# Patient Record
Sex: Female | Born: 1975 | Race: Black or African American | Hispanic: No | Marital: Single | Smoking: Never smoker
Health system: Southern US, Community
[De-identification: ages and names within clinical notes are randomized; demographics above are authoritative.]

## PROBLEM LIST (undated history)

## (undated) ENCOUNTER — Ambulatory Visit (HOSPITAL_COMMUNITY): Admission: EM | Payer: Self-pay

## (undated) DIAGNOSIS — B029 Zoster without complications: Secondary | ICD-10-CM

## (undated) HISTORY — PX: NO PAST SURGERIES: SHX2092

---

## 2001-03-16 ENCOUNTER — Other Ambulatory Visit: Admission: RE | Admit: 2001-03-16 | Discharge: 2001-03-16 | Payer: Self-pay | Admitting: *Deleted

## 2002-06-04 ENCOUNTER — Other Ambulatory Visit: Admission: RE | Admit: 2002-06-04 | Discharge: 2002-06-04 | Payer: Self-pay | Admitting: *Deleted

## 2003-01-03 ENCOUNTER — Other Ambulatory Visit: Admission: RE | Admit: 2003-01-03 | Discharge: 2003-01-03 | Payer: Self-pay | Admitting: *Deleted

## 2003-07-14 ENCOUNTER — Encounter: Payer: Self-pay | Admitting: *Deleted

## 2003-07-14 ENCOUNTER — Inpatient Hospital Stay (HOSPITAL_COMMUNITY): Admission: AD | Admit: 2003-07-14 | Discharge: 2003-07-14 | Payer: Self-pay | Admitting: *Deleted

## 2003-07-15 ENCOUNTER — Encounter (INDEPENDENT_AMBULATORY_CARE_PROVIDER_SITE_OTHER): Payer: Self-pay | Admitting: *Deleted

## 2003-07-15 ENCOUNTER — Inpatient Hospital Stay (HOSPITAL_COMMUNITY): Admission: AD | Admit: 2003-07-15 | Discharge: 2003-07-16 | Payer: Self-pay | Admitting: Obstetrics and Gynecology

## 2003-08-18 ENCOUNTER — Other Ambulatory Visit: Admission: RE | Admit: 2003-08-18 | Discharge: 2003-08-18 | Payer: Self-pay | Admitting: *Deleted

## 2004-09-24 ENCOUNTER — Other Ambulatory Visit: Admission: RE | Admit: 2004-09-24 | Discharge: 2004-09-24 | Payer: Self-pay | Admitting: Obstetrics and Gynecology

## 2005-06-11 ENCOUNTER — Emergency Department (HOSPITAL_COMMUNITY): Admission: EM | Admit: 2005-06-11 | Discharge: 2005-06-11 | Payer: Self-pay | Admitting: Emergency Medicine

## 2005-08-19 ENCOUNTER — Ambulatory Visit (HOSPITAL_COMMUNITY): Admission: RE | Admit: 2005-08-19 | Discharge: 2005-08-19 | Payer: Self-pay | Admitting: Obstetrics and Gynecology

## 2005-09-24 ENCOUNTER — Other Ambulatory Visit: Admission: RE | Admit: 2005-09-24 | Discharge: 2005-09-24 | Payer: Self-pay | Admitting: Obstetrics and Gynecology

## 2005-11-18 ENCOUNTER — Ambulatory Visit: Payer: Self-pay | Admitting: Neonatology

## 2005-11-18 ENCOUNTER — Inpatient Hospital Stay (HOSPITAL_COMMUNITY): Admission: AD | Admit: 2005-11-18 | Discharge: 2005-11-20 | Payer: Self-pay | Admitting: Obstetrics and Gynecology

## 2005-11-18 ENCOUNTER — Encounter (INDEPENDENT_AMBULATORY_CARE_PROVIDER_SITE_OTHER): Payer: Self-pay | Admitting: Specialist

## 2007-07-09 ENCOUNTER — Emergency Department (HOSPITAL_COMMUNITY): Admission: EM | Admit: 2007-07-09 | Discharge: 2007-07-09 | Payer: Self-pay | Admitting: Emergency Medicine
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
## 2011-05-17 NOTE — Discharge Summary (Signed)
Erica Fields, Erica Fields                         ACCOUNT NO.:  [id number]   MEDICAL RECORD NO.:  [id number]                   PATIENT TYPE:  INP   LOCATION:  9302                                 FACILITY:  [facility]   PHYSICIAN:  Dineen Kid. Rana Snare, M.D.                 DATE OF BIRTH:  [birth year]   DATE OF ADMISSION:  07/15/2003  DATE OF DISCHARGE:  07/16/2003                                 DISCHARGE SUMMARY   ADMISSION DIAGNOSES:  1. Intrauterine pregnancy at 37-5/7 weeks estimated gestational age.  2. Intrauterine fetal demise.   DISCHARGE DIAGNOSES:  1. Status post spontaneous vaginal delivery.  2. Nonviable female infant.   PROCEDURE:  Spontaneous vaginal delivery.   REASON FOR ADMISSION:  Please see dictated H&P.   HOSPITAL COURSE:  The patient is a 35 year old gravida 2, para 1 that  presented to the office for a routine OB visit.  In the office, fetal heart  tones were not audible and ultrasound was performed revealing a nonviable  fetus.  Her pregnancy had been uncomplicated.  The patient denied any recent  changes in activity other than not feeling the fetus move yesterday.  The  patient was then sent to the hospital on the evening prior to admission for  confirmation of office findings which revealed ultrasound finding consistent  with intrauterine fetal demise.  The patient was then scheduled for an  induction of labor.  Cervix was 3-4 cm dilated, 90% effaced, vertex at a  minus 2 station.  Artificial rupture of membranes was performed revealing  blood-tinged fluid.  Pitocin was started to initiate labor.  Epidural was  placed for the patient's comfort.  The patient did have a spontaneous  vaginal delivery of a nonviable infant weighing 8 pounds 8 ounces.  No  obvious fetal malformation was noted.  The patient did sustain a first-  degree right vaginal laceration which was repaired with 2-0 chromic.  Mother  did tolerate the procedure well.  Estimated blood loss was less than 400  mL.  On postpartum day #1, CBC, STS, ANA, TFTs, and hemoglobin A1c were drawn.  Temp max during the night was 101.4.  The patient was afebrile the following  morning.  Fundus was firm, uterus even, nontender.  CBC revealed a  hemoglobin of 10.4, platelet count of 203,000, WBC count of 21.6.  Hemoglobin A1c was 5, TSH was normal.  STS was negative.  Other labs were  pending at the patient's discharge.  Consent was signed for fetal autopsy.  Funeral arrangements were made and the patient was discharged home.   CONDITION ON DISCHARGE:  Stable.   DIET:  Regular as tolerated.   ACTIVITY:  No restrictions other than no vaginal entry, douches, tampons, or  intercourse.   FOLLOW UP:  The patient is to follow up in the office in four weeks.  She is  to call for a temperature greater than  100 degrees, excessive heavy vaginal  bleeding.    DISCHARGE MEDICATIONS:  1. Keflex 500 mg one p.o. t.i.d. x1 week.  2. Xanax 0.25 mg one p.o. t.i.d. p.r.n.  3. Ambien 10 mg one p.o. at h.s. p.r.n. insomnia.     Julio Sicks, N.P.                        Dineen Kid Rana Snare, M.D.    CC/MEDQ  D:  08/05/2003  T:  08/05/2003  Job:  [id number]

## 2012-12-30 NOTE — L&D Delivery Note (Signed)
Delivery Note At 7:44 PM a viable female was delivered via  OA  APGAR: pending weight 3 lb 9.5 oz (1630 g).   Placenta status: consistent with placental abruption .  Cord:  with the following complications: loose nuchal cord x 1  Cord pH: not done  Anesthesia: none  Episiotomy:none Lacerations: none Suture Repair: not applicable Est. Blood Loss (mL): 300  Mom to postpartum.  Baby to NICU.  Cheryln Balcom L 07/03/2013, 7:58 PM

## 2013-03-02 LAB — OB RESULTS CONSOLE GC/CHLAMYDIA: Chlamydia: NEGATIVE

## 2013-03-05 ENCOUNTER — Encounter (HOSPITAL_COMMUNITY): Payer: Self-pay | Admitting: Emergency Medicine

## 2013-03-05 ENCOUNTER — Emergency Department (HOSPITAL_COMMUNITY)
Admission: EM | Admit: 2013-03-05 | Discharge: 2013-03-05 | Disposition: A | Discharge status: Home / Self Care | Payer: BC Managed Care – PPO | Attending: Emergency Medicine | Admitting: Emergency Medicine

## 2013-03-05 DIAGNOSIS — O034 Incomplete spontaneous abortion without complication: Secondary | ICD-10-CM | POA: Insufficient documentation

## 2013-03-05 DIAGNOSIS — Z8742 Personal history of other diseases of the female genital tract: Secondary | ICD-10-CM | POA: Insufficient documentation

## 2013-03-05 DIAGNOSIS — O4692 Antepartum hemorrhage, unspecified, second trimester: Secondary | ICD-10-CM

## 2013-03-05 DIAGNOSIS — O09219 Supervision of pregnancy with history of pre-term labor, unspecified trimester: Secondary | ICD-10-CM | POA: Insufficient documentation

## 2013-03-05 DIAGNOSIS — O209 Hemorrhage in early pregnancy, unspecified: Secondary | ICD-10-CM | POA: Insufficient documentation

## 2013-03-05 NOTE — ED Notes (Signed)
Pt states that she is [redacted] weeks pregnant and she was having intercourse and she started bleeding,  She denies cramping but states there was a little pain during intercourse.

## 2013-03-05 NOTE — ED Notes (Signed)
Pt states she is [redacted] weeks pregnant and started having vaginal bleeding after having intercoarse tonight

## 2013-03-05 NOTE — ED Provider Notes (Signed)
History     CSN: [id number]  Arrival date & time 03/05/13  0254   First MD Initiated Contact with Patient 03/05/13 [phone]      Chief Complaint  Patient presents with  . Vaginal Bleeding    (Consider location/radiation/quality/duration/timing/severity/associated sxs/prior treatment) HPI Erica Fields is a 37 y.o. female presents with vaginal bleeding in 13 weeks pregnancy. She is a G4 P1 A2, history of prior miscarriages and prior premature birth. She does have followup at the [facility]. Patient decided to come here because of the weather and is closer to come here. She's having no pain, no contractions, denies any rush of fluid. Earlier this evening was engaged in sexual intercourse and then noticed vaginal bleeding. Vaginal bleeding has been constant, he has been using the same that since it started about an hour or 2 ago, no other alleviating or exacerbating factors no other associated symptoms. She denies any shortness of breath, chest pain, dizziness or lightheadedness the   History reviewed. No pertinent past medical history.  History reviewed. No pertinent past surgical history.  Family History  Problem Relation Age of Onset  . Hypertension Other   . Diabetes Other   . CAD Other     History  Substance Use Topics  . Smoking status: Never Smoker   . Smokeless tobacco: Not on file  . Alcohol Use: No    OB History   Grav Para Term Preterm Abortions TAB SAB Ect Mult Living   4         1      Review of Systems At least 10pt or greater review of systems completed and are negative except where specified in the HPI.  Allergies  Review of patient's allergies indicates no known allergies.  Home Medications  No current outpatient prescriptions on file.  BP 114/60  Pulse 78  Temp(Src) 98 F (36.7 C) (Oral)  Resp 18  Wt 209 lb 8 oz (95.029 kg)  SpO2 100%  LMP 12/09/2012  Physical Exam  Nursing notes reviewed.  Electronic medical record reviewed. VITAL  SIGNS:   Filed Vitals:   03/05/13 0300 03/05/13 0444  BP: 114/60   Pulse: 78   Temp: 98 F (36.7 C)   TempSrc: Oral   Resp: 18   Weight: 209 lb 8 oz (95.029 kg)   SpO2: 100% 100%   CONSTITUTIONAL: Awake, oriented, appears non-toxic HENT: Atraumatic, normocephalic, oral mucosa pink and moist, airway patent. Nares patent without drainage. External ears normal. EYES: Conjunctiva clear, EOMI, PERRLA NECK: Trachea midline, non-tender, supple CARDIOVASCULAR: Normal heart rate, Normal rhythm, No murmurs, rubs, gallops PULMONARY/CHEST: Clear to auscultation, no rhonchi, wheezes, or rales. Symmetrical breath sounds. Non-tender. ABDOMINAL: Non-distended, gravid, soft, non-tender - no rebound or guarding.  BS normal. NEUROLOGIC: Non-focal, moving all four extremities, no gross sensory or motor deficits. EXTREMITIES: No clubbing, cyanosis, or edema SKIN: Warm, Dry, No erythema, No rash PELVIC EXAM: normal external genitalia - some blood noticed at the vulva, vagina has some pooling dark blood in the vault, cervix appears to be opening less than 1 cm this time, but there is a steady stream of dark red fluid and occasional clots coming from it, uterus and adnexa. ED Course  [place] bedside Performed by: Jones Skene Authorized by: Jones Skene Consent: Verbal consent obtained. Consent given by: patient Local anesthesia used: no Patient sedated: no Comments: Transabdominal bedside ultrasound shows a single intrauterine fetus,  Heart rate 140-150, moving   (including critical care time)  Labs Reviewed  WET PREP, GENITAL   No results found.   1. Inevitable abortion   2. Vaginal bleeding in pregnancy, second trimester       MDM  Erica Fields is a 37 y.o. female presenting with vaginal bleeding and a second trimester, patient's os is open on physical exam, there continues to be clots and what appeared to be small pieces of tissue coming through. At this point, the fetus is still  alive, with a healthy heart rate in the 140s to 150s. I discussed this at length with the mother at bedside and the father, this is likely in an inevitable abortion, she will need followup with OB/GYN for repeat pelvic exam, and repeat ultrasound if she does not pass the products of conception.  Patient understands and accepts the medical plan, she knows she needs to followup with OB/GYN, she is to return immediately for any worsening symptoms including syncope, shortness of breath, chest pain, or any other concerning symptoms.        Jones Skene, MD 03/09/13 1349

## 2013-03-09 LAB — OB RESULTS CONSOLE ANTIBODY SCREEN: Antibody Screen: NEGATIVE

## 2013-03-09 LAB — OB RESULTS CONSOLE HIV ANTIBODY (ROUTINE TESTING): HIV: NONREACTIVE

## 2013-03-09 LAB — OB RESULTS CONSOLE ABO/RH: RH Type: POSITIVE

## 2013-03-09 LAB — OB RESULTS CONSOLE RPR: RPR: NONREACTIVE

## 2013-03-09 LAB — OB RESULTS CONSOLE HEPATITIS B SURFACE ANTIGEN: Hepatitis B Surface Ag: NEGATIVE

## 2013-06-04 ENCOUNTER — Inpatient Hospital Stay (HOSPITAL_COMMUNITY)
Admission: AD | Admit: 2013-06-04 | Discharge: 2013-07-06 | DRG: 372 | Disposition: A | Discharge status: Home / Self Care | Payer: BC Managed Care – PPO | Source: Ambulatory Visit | Attending: Obstetrics and Gynecology | Admitting: Obstetrics and Gynecology

## 2013-06-04 ENCOUNTER — Encounter (HOSPITAL_COMMUNITY): Payer: Self-pay | Admitting: *Deleted

## 2013-06-04 DIAGNOSIS — Z3689 Encounter for other specified antenatal screening: Secondary | ICD-10-CM

## 2013-06-04 DIAGNOSIS — O4290 Premature rupture of membranes, unspecified as to length of time between rupture and onset of labor, unspecified weeks of gestation: Secondary | ICD-10-CM | POA: Diagnosis present

## 2013-06-04 DIAGNOSIS — O4590 Premature separation of placenta, unspecified, unspecified trimester: Secondary | ICD-10-CM | POA: Diagnosis not present

## 2013-06-04 DIAGNOSIS — O09529 Supervision of elderly multigravida, unspecified trimester: Secondary | ICD-10-CM | POA: Diagnosis present

## 2013-06-04 DIAGNOSIS — O321XX0 Maternal care for breech presentation, not applicable or unspecified: Secondary | ICD-10-CM | POA: Diagnosis present

## 2013-06-04 DIAGNOSIS — O3430 Maternal care for cervical incompetence, unspecified trimester: Principal | ICD-10-CM | POA: Diagnosis present

## 2013-06-04 DIAGNOSIS — O26879 Cervical shortening, unspecified trimester: Secondary | ICD-10-CM | POA: Diagnosis present

## 2013-06-04 LAB — CBC
HCT: 29.1 % — ABNORMAL LOW (ref 36.0–46.0)
MCV: 76.8 fL — ABNORMAL LOW (ref 78.0–100.0)
Platelets: 214 10*3/uL (ref 150–400)
RBC: 3.79 MIL/uL — ABNORMAL LOW (ref 3.87–5.11)
WBC: 7.8 10*3/uL (ref 4.0–10.5)

## 2013-06-04 MED ORDER — CALCIUM CARBONATE ANTACID 500 MG PO CHEW: 2.0000 | CHEWABLE_TABLET | ORAL | Status: DC | PRN

## 2013-06-04 MED ORDER — DOCUSATE SODIUM 100 MG PO CAPS
100.0000 mg | ORAL_CAPSULE | Freq: Every day | ORAL | Status: DC
  Administered 2013-06-05 – 2013-06-06 (×2): 100 mg via ORAL
  Filled 2013-06-04 (×2): qty 1

## 2013-06-04 MED ORDER — ZOLPIDEM TARTRATE 5 MG PO TABS: 5.0000 mg | ORAL_TABLET | Freq: Every evening | ORAL | Status: DC | PRN

## 2013-06-04 MED ORDER — BETAMETHASONE SOD PHOS & ACET 6 (3-3) MG/ML IJ SUSP
12.0000 mg | INTRAMUSCULAR | Status: AC
  Administered 2013-06-04 – 2013-06-05 (×2): 12 mg via INTRAMUSCULAR
  Filled 2013-06-04 (×2): qty 2

## 2013-06-04 MED ORDER — HYDROXYPROGESTERONE CAPROATE 250 MG/ML IM OIL: 250.0000 mg | TOPICAL_OIL | INTRAMUSCULAR | Status: DC

## 2013-06-04 MED ORDER — PRENATAL MULTIVITAMIN CH
1.0000 | ORAL_TABLET | Freq: Every day | ORAL | Status: DC
  Administered 2013-06-06: 1 via ORAL
  Filled 2013-06-04 (×2): qty 1

## 2013-06-04 MED ORDER — ACETAMINOPHEN 325 MG PO TABS: 650.0000 mg | ORAL_TABLET | ORAL | Status: DC | PRN

## 2013-06-04 NOTE — Progress Notes (Signed)
Pt GBS completed 06/04/13 1410

## 2013-06-04 NOTE — H&P (Signed)
37 yo [id number] @ 26+2 wks presents for admission because of ACE/ACD.  Pt had [place] in office today and incidentally found to have cvx length of 1.1cm (previously 3.3 at 18 wk [place]).  Denies ctx, vb, and lof.  + FM  PObHx:  G1: EAB, G2: 38wks IUFD/SVD, G3: 22wks SVD - female infant living, G4: 6 wks SAB, G5: current PMHx: abnormal pap - cryo '99 PSHx:  Neg All:  None Meds:  P4 weekly inj, PNV SHx:  Negative tobacco  Af, VSS + FHT  Toco quiet Gen - NAD Abd - gravid, NT Ext - NT, no edema PV - 2cm per MD exam in office  [place]:  cvx 1.1 cm w/ funnel  A/P:  ACE/ACD at 26 wks with poor pregnancy history Admit Bedrest BMZ (will do gtt first) Continue progesterone inj Re-evaluate cervix after steroids Tocolysis if evidence of PTL

## 2013-06-05 ENCOUNTER — Encounter (HOSPITAL_COMMUNITY): Payer: Self-pay | Admitting: *Deleted

## 2013-06-05 NOTE — Progress Notes (Signed)
Pt denies ctx, vb and lof.  + FM AF, VSS + FHT Toco quiet  Gen - NAD Abd - gravid, NT Ext - NT PV - deferred  A/P:  Continue hospital bedrest & BMZ & P4 Re assess cervix Monday and consider home bedrest if stable

## 2013-06-06 ENCOUNTER — Inpatient Hospital Stay (HOSPITAL_COMMUNITY): Payer: BC Managed Care – PPO

## 2013-06-06 MED ORDER — DEXTROSE 5 % IV SOLN
500.0000 mg | INTRAVENOUS | Status: DC
  Administered 2013-06-06: 500 mg via INTRAVENOUS
  Filled 2013-06-06 (×2): qty 500

## 2013-06-06 MED ORDER — SODIUM CHLORIDE 0.9 % IV SOLN
1.0000 g | INTRAVENOUS | Status: DC
  Filled 2013-06-06 (×4): qty 1000

## 2013-06-06 MED ORDER — SODIUM CHLORIDE 0.9 % IV SOLN
1.0000 g | INTRAVENOUS | Status: DC
  Administered 2013-06-06 – 2013-06-07 (×3): 1 g via INTRAVENOUS
  Filled 2013-06-06 (×6): qty 1000

## 2013-06-06 MED ORDER — AZITHROMYCIN 500 MG PO TABS: 500.0000 mg | ORAL_TABLET | Freq: Every day | ORAL | Status: DC

## 2013-06-06 MED ORDER — AMOXICILLIN 500 MG PO CAPS: 500.0000 mg | ORAL_CAPSULE | Freq: Two times a day (BID) | ORAL | Status: DC

## 2013-06-06 MED ORDER — AMPICILLIN SODIUM 2 G IJ SOLR
2.0000 g | Freq: Once | INTRAMUSCULAR | Status: AC
  Administered 2013-06-06: 2 g via INTRAVENOUS
  Filled 2013-06-06: qty 2000

## 2013-06-06 NOTE — Progress Notes (Signed)
Pt denies ctx, vb and lof.  + FM  AF, VSS + FHT Toco quiet gen - NAD Abd - gravid, NT Cvx - deferred  A/P:  ACE/ACD S/p BMZ Recheck cervical length today Continue bedrest, progesterone

## 2013-06-06 NOTE — Progress Notes (Signed)
Ultrasound called with results of cervical length - no measurable cervix with bulging membranes.  Breech presentation.  Pt denies change in sx.  Denies ctx, vb and lof.  + FM  Plan for prolonged hospital bedrest S/p bmz Continue progesterone Start latency abx now that membranes exposed

## 2013-06-07 LAB — CULTURE, BETA STREP (GROUP B ONLY)

## 2013-06-07 MED ORDER — CALCIUM CARBONATE ANTACID 500 MG PO CHEW: 2.0000 | CHEWABLE_TABLET | ORAL | Status: DC | PRN

## 2013-06-07 MED ORDER — CITRIC ACID-SODIUM CITRATE 334-500 MG/5ML PO SOLN: 30.0000 mL | ORAL | Status: DC | PRN

## 2013-06-07 MED ORDER — DEXTROSE 5 % IV SOLN
500.0000 mg | INTRAVENOUS | Status: AC
  Administered 2013-06-07: 500 mg via INTRAVENOUS
  Filled 2013-06-07: qty 500

## 2013-06-07 MED ORDER — SODIUM CHLORIDE 0.9 % IV SOLN
2.0000 g | Freq: Once | INTRAVENOUS | Status: DC
  Filled 2013-06-07: qty 2000

## 2013-06-07 MED ORDER — AMOXICILLIN 500 MG PO CAPS
500.0000 mg | ORAL_CAPSULE | Freq: Three times a day (TID) | ORAL | Status: AC
  Administered 2013-06-08 – 2013-06-13 (×15): 500 mg via ORAL
  Filled 2013-06-07 (×15): qty 1

## 2013-06-07 MED ORDER — ACETAMINOPHEN 325 MG PO TABS: 650.0000 mg | ORAL_TABLET | ORAL | Status: DC | PRN

## 2013-06-07 MED ORDER — LIDOCAINE HCL (PF) 1 % IJ SOLN: 30.0000 mL | INTRAMUSCULAR | Status: DC | PRN

## 2013-06-07 MED ORDER — OXYTOCIN BOLUS FROM INFUSION: 500.0000 mL | INTRAVENOUS | Status: DC

## 2013-06-07 MED ORDER — HYDROXYPROGESTERONE CAPROATE 250 MG/ML IM OIL
250.0000 mg | TOPICAL_OIL | INTRAMUSCULAR | Status: DC
  Administered 2013-06-11 – 2013-07-02 (×4): 250 mg via INTRAMUSCULAR
  Filled 2013-06-07 (×3): qty 1

## 2013-06-07 MED ORDER — PRENATAL MULTIVITAMIN CH
1.0000 | ORAL_TABLET | Freq: Every day | ORAL | Status: DC
  Administered 2013-06-07 – 2013-07-03 (×27): 1 via ORAL
  Filled 2013-06-07 (×28): qty 1

## 2013-06-07 MED ORDER — ZOLPIDEM TARTRATE 5 MG PO TABS: 5.0000 mg | ORAL_TABLET | Freq: Every evening | ORAL | Status: DC | PRN

## 2013-06-07 MED ORDER — ONDANSETRON HCL 4 MG/2ML IJ SOLN: 4.0000 mg | Freq: Four times a day (QID) | INTRAMUSCULAR | Status: DC | PRN

## 2013-06-07 MED ORDER — OXYCODONE-ACETAMINOPHEN 5-325 MG PO TABS: 1.0000 | ORAL_TABLET | ORAL | Status: DC | PRN

## 2013-06-07 MED ORDER — LACTATED RINGERS IV SOLN: 500.0000 mL | INTRAVENOUS | Status: DC | PRN

## 2013-06-07 MED ORDER — SODIUM CHLORIDE 0.9 % IV SOLN
2.0000 g | Freq: Four times a day (QID) | INTRAVENOUS | Status: AC
  Administered 2013-06-07 – 2013-06-08 (×6): 2 g via INTRAVENOUS
  Filled 2013-06-07 (×6): qty 2000

## 2013-06-07 MED ORDER — DOCUSATE SODIUM 100 MG PO CAPS
100.0000 mg | ORAL_CAPSULE | Freq: Every day | ORAL | Status: DC
  Administered 2013-06-07 – 2013-07-02 (×26): 100 mg via ORAL
  Filled 2013-06-07 (×26): qty 1

## 2013-06-07 MED ORDER — OXYTOCIN 40 UNITS IN LACTATED RINGERS INFUSION - SIMPLE MED: 62.5000 mL/h | INTRAVENOUS | Status: DC

## 2013-06-07 MED ORDER — AZITHROMYCIN 500 MG PO TABS
500.0000 mg | ORAL_TABLET | Freq: Every day | ORAL | Status: AC
  Administered 2013-06-08 – 2013-06-12 (×5): 500 mg via ORAL
  Filled 2013-06-07 (×6): qty 1

## 2013-06-07 MED ORDER — LACTATED RINGERS IV SOLN: INTRAVENOUS | Status: DC

## 2013-06-07 MED ORDER — IBUPROFEN 600 MG PO TABS: 600.0000 mg | ORAL_TABLET | Freq: Four times a day (QID) | ORAL | Status: DC | PRN

## 2013-06-07 NOTE — Progress Notes (Signed)
[redacted]w[redacted]d   S//  Resting comft, no c/o  O//BP 103/56  Pulse 79  Temp(Src) 99 F (37.2 C) (Oral)  Resp 18  Ht 5\' 7"  (1.702 m)  Wt 219 lb (99.338 kg)  BMI 34.29 kg/m2  LMP 12/09/2012  FHR stable, rare ctx   A+P// [redacted]w[redacted]d, Breech, PTL/short cx, cont BR + ABX

## 2013-06-07 NOTE — Progress Notes (Addendum)
Pt. Reported to me that she thinks she lost her mucous plug, but not leaking of fluid or vaginal bleeding at this time. She is also not contracting. Will continue to monitor.

## 2013-06-08 MED ORDER — POLYETHYLENE GLYCOL 3350 17 G PO PACK
17.0000 g | PACK | Freq: Every day | ORAL | Status: DC
  Administered 2013-06-08 – 2013-07-02 (×23): 17 g via ORAL
  Filled 2013-06-08 (×27): qty 1

## 2013-06-08 MED ORDER — SODIUM CHLORIDE 0.9 % IJ SOLN
3.0000 mL | Freq: Two times a day (BID) | INTRAMUSCULAR | Status: DC
  Administered 2013-06-09 (×2): 3 mL via INTRAVENOUS

## 2013-06-08 NOTE — Progress Notes (Signed)
No current c/o.  No change in discharge.  No VB, LOF, CTX.  +FM.    VSS. AF Toco quiet FHT Cat I Gen: A&O x 3 Abd: soft, gravid, NT Ext: no c/c/e  36yo [id number] at [redacted]w[redacted]d with ACE/ACD -D3 latency abx for exposed membranes -BMZ mature  -Continue progesterone -Bedrest

## 2013-06-09 MED ORDER — BISACODYL 10 MG RE SUPP
10.0000 mg | Freq: Once | RECTAL | Status: AC
  Administered 2013-06-09: 10 mg via RECTAL
  Filled 2013-06-09: qty 1

## 2013-06-09 NOTE — Consult Note (Signed)
Asked by Dr. Vincente Poli to provide prenatal consultation for patient at risk for preterm delivery due to preterm labor/cervical incompetence.  Mother is 36y.o. G5 P1-1-2-1 who is now [redacted] weeks EGA, was admitted 6/6 for cervical shortening and has been treated with betamethasone, progesterone, and antibiotics (for bulging membranes).  Discussed usual expectations for preterm infant at 27+ weeks gestation, including possible needs for DR resuscitation, respiratory support, IV access, and blood products.  Also presented risks of death or serious morbidity, including developmental disability.  Projected possible length of stay in NICU until 36 - [redacted] wks EGA.  She had a previous 23-week preterm female Jeri Modena) who had a prolonged NICU course here.    Discussed advantages of feeding with mother's milk, and encouraged her to consider pumping postnatally.  Patient was attentive, had appropriate questions, and was appreciative of my input.  Thank you for the consultation.  Aiyanna Awtrey E. Barrie Dunker., MD  Face-to-face time 20 minutes, total time 25 minutes

## 2013-06-09 NOTE — Progress Notes (Signed)
S: Patient is resting. Reports good fetal movement. Had some tightening last night - none now.   O: Afebrile VSS General alert and oriented Lung CTAB Car RRR Abdomen is soft and non tender  IMPRESSION: IUP at 27 weeks Advanced dilation/ probable cervical incompetence Was Breech yesterday  PLAN: Completed steroid series Complete antibiotics for bulging membranes - would discontinue after that Magnesium for neuroprotection if delivery imminent Check fetal position if delivery imminent - discussed with patient C Section may be necessary Continue IM Progesterone Neonatology consult

## 2013-06-09 NOTE — Progress Notes (Signed)
NICU consult called with Dr. Mikle Bosworth

## 2013-06-10 NOTE — Progress Notes (Signed)
Pt denies ctx, vb and lof.  + FM - reports she thinks baby flipped to vtx  AF, VSS  + FHT Gen - NAD Abd - gravid, NT  A/P:  Continue hospital bedrest, weekly progesterone, latency abx Consider rpt ultrasound at 28wks

## 2013-06-11 NOTE — Progress Notes (Signed)
Antenatal Nutrition Assessment:  Currently  27 2/[redacted] weeks gestation, with thin cervix. Height  67" Weight 221 Lbs pre-pregnancy weight unknown, PNR not available  IBW 135 Lbs  Total weight gain unknown. Weight gain goals 11- 20 lbs. Pt reports good weight gain.  Estimated needs: 2000-2200 kcal/day, 76-86 g grams protein/day, 2.4 liters fluid/day  Antenatal Regular diet tolerated well, appetite good. Current diet prescription will provide for increased needs.  No abnormal nutrition related labs  Nutrition Dx: Increased nutrient needs r/t pregnancy and fetal growth requirements aeb [redacted] weeks gestation.  No educational needs assessed at this time.  Elisabeth Cara M.Odis Luster LDN Neonatal Nutrition Support Specialist Pager [phone]

## 2013-06-11 NOTE — Progress Notes (Signed)
Erica Fields was in good spirits and is coping well with her situation.  She has a son who is 38 years old who was born here at [facility], at her report, at 22.5 weeks.  She is grateful and hopeful; she has a strong faith and good family support that is helping her through this.  We will continue to follow up with her, but please also page as needs arise, [phone].  [address] [place] Pager, [phone] 12:05 PM   06/11/13 1200  Clinical Encounter Type  Visited With Patient  Visit Type Initial

## 2013-06-11 NOTE — Progress Notes (Signed)
27 2/7 wks No bleeding/leaking No hard UCs  VSS Afeb FHT + Ut soft/NT  A: ACD/ACE  P: Continue hospitalization, BR, IM progesterone, latency ATB      C/W patient possible C/S for delivery depending on presentation      Magnesium Sulfate for neuro prophylaxis if possible

## 2013-06-12 NOTE — Progress Notes (Signed)
27 3/7 wks No C/O Had 3 UCs last pm, OK today No leaking/bleeding  VSS Afeb Ut soft and NT  FHT + accels UCs irregular and mild  A: ACE/ACD with poor ob history at 27 3/7 weeks P: Continue present care and reevaluate at 28 weeks      C/W patient plan

## 2013-06-12 NOTE — Progress Notes (Signed)
Pt moved to room 151 (for a better view) via w/c

## 2013-06-13 MED ORDER — BISACODYL 10 MG RE SUPP
10.0000 mg | Freq: Once | RECTAL | Status: AC
  Administered 2013-06-13: 10 mg via RECTAL
  Filled 2013-06-13: qty 1

## 2013-06-13 NOTE — Progress Notes (Signed)
27 4/7 weeks No leaking/bleeding Constipated  VSS Afeb Ut soft/NT FHT +accels UCs irreg / mild  A: ACE/ACD-no measurable cervix on U/S  P: Continue hospital BR

## 2013-06-14 ENCOUNTER — Inpatient Hospital Stay (HOSPITAL_COMMUNITY): Payer: BC Managed Care – PPO

## 2013-06-14 NOTE — Progress Notes (Signed)
Pt up to the bathroom for a.m. care

## 2013-06-14 NOTE — Progress Notes (Signed)
Patient ID: Erica Fields, female   DOB: Dec 30, 1976, 37 y.o.   MRN: [id number] No ctx's good fetal movement Af vss Gravid uterus nontender IUP at 27.5 with preterm cervical changes Continue rest

## 2013-06-14 NOTE — Progress Notes (Signed)
FHR strip non-reactive, mild contractions noted on monitor that pt denies feeling.  Orders received for BPP

## 2013-06-15 ENCOUNTER — Inpatient Hospital Stay (HOSPITAL_COMMUNITY): Payer: BC Managed Care – PPO

## 2013-06-15 ENCOUNTER — Ambulatory Visit (HOSPITAL_COMMUNITY): Payer: BC Managed Care – PPO

## 2013-06-15 NOTE — Consult Note (Signed)
MFM consult  37 yr old [id number] at [redacted]w[redacted]d with prior IUFD at 38 weeks, prior preterm delivery at 23-26 weeks, and now with preterm cervical dilation for fetal ultrasound and consult.  Patient was being followed with cervical length and is receiving 17OH progesterone given history. Cervical length had been normal. Presented to OB with vaginal bleeding and was found to be 2cm dilated. Reports no contractions. Has received betamethasone course.   Past OB hx:  Elective termination 38 week presented and found no fetal heart tones- SVD; autopsy normal; no clear etiology 3rd pregnancy: PPROM at patient reports [redacted]w[redacted]d- delivered female infant weighing over 1lb so likely more like 24-27 weeks; child is living 4th pregnancy; first trimester miscarriage  No other significant medical history  Ultrasound today shows: estimated fetal weight is in the 61st%. Anterior placenta without evidence of previa. Normal amniotic fluid volume. The limited anatomy survey is normal. The fetus is in cephalic presentation.  I counseled the patient as follows: 1. Appropriate fetal growth. 2. Normal limited anatomy survey. Had normal full anatomic survey with primary OB. 3. Advanced maternal age: - previously counseled - had normal MaterniT21 screening 4. Previous IUFD: - discussed unknown etiology - can consider antiphospholipid antibody panel - discussed low risk of recurrence - recommend fetal growth every 4 weeks - recommend antenatal testing starting at [redacted] weeks gestation - discussed ACOG recommendations for delivery- delivery by estimated due date but not prior to 39 weeks in the absence of other complications; or 37-39 weeks with an amniocentesis for fetal lung maturity - recommend fetal kick counts 5. Previous preterm delivery:  I discussed that with a history of preterm delivery the risk is increased in future pregnancies. Studies have shown that the frequency of recurrent preterm birth was 14 to 22 % after  one preterm delivery, 28 to 42 % after two preterm deliveries, and 67% after three preterm deliveries. Term births decreased the risk of preterm birth in subsequent pregnancies. Discussed that 17OH progesterone has been shown to reduce the recurrence risk of preterm delivery by up to 40%- patient has been on 17OH progesterone. 6. Preterm cervical dilation: - discussed increased risk of preterm delivery - s/p betamethasone- discussed benefits of betamethasone in reducing morbidity and mortality - do not recommend maintenance tocolytics; but if having contractions with cervical change recommend tocolysis - would recommend magnesium sulfate for fetal neuroprotection if delivery were imminent prior to [redacted] weeks gestation - patient has been monitored as an inpatient for 11 days - recommend repeat cervical exam and if stable and no regular contractions recommend manage as outpatient on modified activity and close monitoring for signs/symptoms of preterm labor/PPROM - may consider inpatient management until 30 weeks if patient more comfortable - patient has had NICU consult   Briefly discussed possible management strategies for future pregnancies if patient desires future pregnancies. Discussed would depend on outcome of this pregnancy but 17OH progesterone would again be recommended vs cerclage if patient has another preterm delivery with painless cervical dilation. Would recommend early consult in future pregnancy to determine management.  Above discussed with Dr. Rana Snare.  I spent 40 minutes in face to face consultation with the patient in addition to time spent on the ultrasound. Please call with questions.  Eulis Foster, MD

## 2013-06-15 NOTE — Progress Notes (Signed)
[facility] ultrasound  Indication: 37 yr old [id number] at [redacted]w[redacted]d with prior IUFD at 71 weeks, prior preterm delivery at 23-26 weeks, and now with preterm cervical dilation for fetal ultrasound.  Findings: 1. Single intrauterine pregnancy. 2. Estimated fetal weight is in the 61st%. 3. Anterior placenta without evidence of previa. 4. Normal amniotic fluid volume. 5. The limited anatomy survey is normal. 6. The fetus is in cephalic presentation.  Recommendations: 1. Appropriate fetal growth. 2. Normal limited anatomy survey. 3. Advanced maternal age: - previously counseled - had normal MaterniT21 screening 4. Previous IUFD: - see consult letter 5. Previous preterm delivery: - see consult letter 6. Preterm cervical dilation: - see consult letter - s/p betamethasone  Eulis Foster, MD

## 2013-06-15 NOTE — Progress Notes (Addendum)
Patient ID: Erica Fields, female   DOB: 10/30/1976, 37 y.o.   MRN: [id number] Pt without complaints GFM No ctxs VSSAF FHR 140s  Rare ctxs Abd Gravid nt Neg homans bil  Advanced Cervical Change at 27 6/7, no evidence of PTL at this time Continue BR/ Progesterone/ S/P BMZ Poor Preg History - (22 delivery and 38 week IUFD) [place] for EFW 28weeks with MFM  DL

## 2013-06-16 NOTE — Progress Notes (Signed)
No current c/o.  No CTX, change in d/c.  +FM.  VSS. FHT: Cat I Abd: soft, NT Ext: no c/c/e U/S yesterday with growth and anatomy wnl; vtx  36yo [id number] at [redacted]w[redacted]d with ACE Continue bedrest Continue 17-P S/P BMZ Will consider outpt management at 30 weeks per MFM rec

## 2013-06-17 NOTE — Progress Notes (Signed)
[redacted]w[redacted]d   S/  Rested well last PM  O// BP 107/50  Pulse 89  Temp(Src) 97.9 F (36.6 C) (Oral)  Resp 18  Ht 5\' 7"  (1.702 m)  Wt 221 lb 1.6 oz (100.29 kg)  BMI 34.62 kg/m2  LMP 12/09/2012  Stable FHR  A+P//  28 weeks, last [place] vtx, ACE, cont BR

## 2013-06-18 NOTE — Progress Notes (Signed)
S: Patient is resting Good FM No contractions or vaginal bleeding O: Afebrile VSS Uterus is non tender  IMPRESSION: 37 year old G 5 P 1121 at 62 w 2 days with advanced cervical dilation  PLAN: Continue bedrest Continue progesterone Completed steroid series

## 2013-06-19 NOTE — Progress Notes (Signed)
S: Patient is sleeping. No complaints. Good FM.   O: afebrile vss Abdomen is soft and non tender  IMPRESSION: 38 year old G 5 P 1121 at 10 w 3 days Advanced cervical dilatation  PLAN: Continue bedrest Continue progesterone Completed steroid series

## 2013-06-20 NOTE — Progress Notes (Signed)
S:  No complaints. Good FM  O:  Afebrile VSS Abdomen is soft and non tender  IMPRESSION: 37 year old G 5 P 1121 at 44 w 4 days Advanced cervical dilation  PLAN: Continue bedrest  Continue Progesterone Completed steroid series

## 2013-06-21 NOTE — Progress Notes (Signed)
Pt requesting to leave the toco on because she stated that she feels contractions as soon as she is taken off the monitor.  Cardio off and toco left on.

## 2013-06-21 NOTE — Progress Notes (Signed)
Per MD watch for another couple hours

## 2013-06-21 NOTE — Progress Notes (Signed)
[redacted]w[redacted]d   S//no c/o. Resting  O// BP 122/66  Pulse 104  Temp(Src) 98.7 F (37.1 C) (Oral)  Resp 18  Ht 5\' 7"  (1.702 m)  Wt 221 lb 1.6 oz (100.29 kg)  BMI 34.62 kg/m2  LMP 12/09/2012  FHR stable, 140's  A+P//[redacted]w[redacted]d    ACE>>f/u US this Friday, consider OPT mgmt after 30 wks

## 2013-06-21 NOTE — Progress Notes (Signed)
Ur chart review completed.  

## 2013-06-22 MED ORDER — NIFEDIPINE 10 MG PO CAPS
10.0000 mg | ORAL_CAPSULE | Freq: Three times a day (TID) | ORAL | Status: DC
  Administered 2013-06-22 – 2013-07-03 (×35): 10 mg via ORAL
  Filled 2013-06-22 (×37): qty 1

## 2013-06-22 MED ORDER — TERBUTALINE SULFATE 1 MG/ML IJ SOLN
0.2500 mg | Freq: Once | INTRAMUSCULAR | Status: AC
  Administered 2013-06-22: 0.25 mg via SUBCUTANEOUS
  Filled 2013-06-22: qty 1

## 2013-06-22 NOTE — Progress Notes (Signed)
Patient ID: Erica Fields, female   DOB: [birth year], 37 y.o.   MRN: [id number] IUP AT 28.6 WITH ACD AND EFFACEMENT AF VSS INCREASED CTX'S LAST PM RECEIVED TERB STILL SOME IRREGULAR CTX MILD FHR LAST PM CAT 1 CERVIX 2-3 50% MEMB AT THE CERVIX WILL START PROCARDIA

## 2013-06-23 MED ORDER — DIPHENHYDRAMINE HCL 25 MG PO CAPS
50.0000 mg | ORAL_CAPSULE | Freq: Once | ORAL | Status: AC
  Administered 2013-06-23: 50 mg via ORAL
  Filled 2013-06-23: qty 2

## 2013-06-23 NOTE — Progress Notes (Signed)
Pt reports occ ctx - not regular or painful.  Good FM  AF, VSS Gen - NAD Abd - gravid, NT Ext - NT  A/P: continue hospital bedrest Procardia Progesterone

## 2013-06-23 NOTE — Progress Notes (Signed)
Patient called out to nurses station stating she thought she was having an allergic reaction.  RN went to bedside and patient had red bumps on her upper thighs and arms and were itching.  Patient denies changing lotions, soaps, detergents, or new foods. Patient has not taken any new medications either.  Dr Renaldo Fiddler was notified by phone and orders were received to give 50 mg Benadryl once.  Will follow up to see if medication was effective.

## 2013-06-24 NOTE — Progress Notes (Addendum)
No current c/o. 1 CTX yesterday, no change in d/c. +FM.  ?Allergic rxn on thighs and abd which started last night resolved with one dose of Benadryl.    VSS.  FHT: 145. Cat I   Abd: soft, NT.  No visible skin abnl. Ext: no c/c/e.  No visible skin abnl.  36yo [id number] at [redacted]w[redacted]d with ACE/ACD Continue bedrest  Continue 17-P  S/P BMZ  Continue Procardia Will consider outpt management at 30 weeks per MFM rec

## 2013-06-25 ENCOUNTER — Inpatient Hospital Stay (HOSPITAL_COMMUNITY): Payer: BC Managed Care – PPO

## 2013-06-25 NOTE — Progress Notes (Signed)
Patient ID: Erica Fields, female   DOB: [birth year], 37 y.o.   MRN: [id number] Pt without complaints Occas ctxs VSSAF FHR 140s Ctxs occas Abd Gravid nt  36yo [id number] at [redacted]w[redacted]d with ACE/ACD  Continue bedrest  Continue 17-P  S/P BMZ  Continue Procardia  Will consider outpt management at 30 weeks per MFM rec

## 2013-06-26 NOTE — Progress Notes (Signed)
Patient ID: Erica Fields, female   DOB: Apr 08, 1976, 37 y.o.   MRN: [id number] Pt without complaints  Occas ctxs  VSSAF  FHR 140s  Ctxs occas  Abd Gravid nt  36yo [id number] at [redacted]w[redacted]d with ACE/ACD  Continue bedrest  Continue 17-P  S/P BMZ  Continue Procardia  Will consider outpt management at 30 weeks per MFM rec

## 2013-06-27 NOTE — Progress Notes (Signed)
Pt stating that she vomitted her breakfast, declines the need for meds, not nauseated anymore.

## 2013-06-27 NOTE — Progress Notes (Signed)
Patient ID: Erica Fields, female   DOB: [birth year], 37 y.o.   MRN: [id number] Pt without complaints GFM  Rare Ctxs VSSAF FHR 145s  + accels Rare ctxs Abd Gravid, nt Neg homans bil  36yo [id number] at [redacted]w[redacted]d with ACE/ACD  Continue bedrest  Continue 17-P  S/P BMZ  Continue Procardia  Will consider outpt management at 30 weeks per MFM rec

## 2013-06-28 MED ORDER — NIFEDIPINE 10 MG PO CAPS
10.0000 mg | ORAL_CAPSULE | Freq: Once | ORAL | Status: AC
  Administered 2013-06-29: 10 mg via ORAL
  Filled 2013-06-28: qty 1

## 2013-06-28 NOTE — Progress Notes (Signed)
S:  Patient is resting. Occasional contractions. Reports normal fetal movement.  O:  Afebrile VSS General alert and oriented Lung CTAB Car RRR Abdomen is soft and non tender  IMPRESSION: 37 year old G 5 P 1121 at 65 w 5 days Advanced cervical dilation  Continue progesterone and procardia Plan is to reevaluate at 30 weeks with MFM to consider outpatient management Last exam 2 to 3 cm dilated

## 2013-06-28 NOTE — Progress Notes (Signed)
Ur chart review completed.  

## 2013-06-29 ENCOUNTER — Inpatient Hospital Stay (HOSPITAL_COMMUNITY): Payer: BC Managed Care – PPO

## 2013-06-29 MED ORDER — NIFEDIPINE 10 MG PO CAPS
10.0000 mg | ORAL_CAPSULE | Freq: Once | ORAL | Status: AC
  Administered 2013-06-29: 10 mg via ORAL

## 2013-06-29 NOTE — Progress Notes (Signed)
FM  Off.  Pt. OOB ambulating to BR.

## 2013-06-29 NOTE — Progress Notes (Signed)
Patient ID: Erica Fields, female   DOB: [birth year], 37 y.o.   MRN: [id number] S: CTX LAST PM TREATED WITH PROCARDIA O: AF VSS      GRAVID UTERUS NONTENDER      CERVIX 2-3  50 % NO CHANGE      FHR LAST PM CAT 1 A: IUP AT 29.6 WITH PRETERM LABOR P:  CONTINUE PROCARDIA

## 2013-06-29 NOTE — Progress Notes (Signed)
06/29/13 1300  Clinical Encounter Type  Visited With Patient  Visit Type Follow-up;Spiritual support;Social support  Spiritual Encounters  Spiritual Needs Emotional   Ms No was upbeat and grateful to get to visit with her son Jeri Modena (7), who has been staying with her mother while pt is here at [facility].  She reports good support and strong motivation to stay here as long as needed to take best care of baby.  Provided pastoral listening and encouragement.  Will continue to follow for support.  [address] [place], [place] [phone]

## 2013-06-29 NOTE — Progress Notes (Signed)
Dr. Arelia Sneddon informed pt. That her cervix was unchange.

## 2013-06-30 LAB — AMNISURE RUPTURE OF MEMBRANE (ROM) NOT AT ARMC: Amnisure ROM: POSITIVE

## 2013-06-30 MED ORDER — AZITHROMYCIN 500 MG PO TABS
500.0000 mg | ORAL_TABLET | Freq: Every day | ORAL | Status: DC
  Administered 2013-07-02 – 2013-07-03 (×2): 500 mg via ORAL
  Filled 2013-06-30 (×3): qty 1

## 2013-06-30 MED ORDER — AMOXICILLIN 500 MG PO CAPS
500.0000 mg | ORAL_CAPSULE | Freq: Three times a day (TID) | ORAL | Status: DC
  Administered 2013-07-02 – 2013-07-03 (×6): 500 mg via ORAL
  Filled 2013-06-30 (×8): qty 1

## 2013-06-30 MED ORDER — LACTATED RINGERS IV SOLN
INTRAVENOUS | Status: DC
  Administered 2013-06-30 – 2013-07-02 (×4): via INTRAVENOUS

## 2013-06-30 MED ORDER — SODIUM CHLORIDE 0.9 % IV SOLN
2.0000 g | Freq: Four times a day (QID) | INTRAVENOUS | Status: AC
  Administered 2013-06-30 – 2013-07-01 (×8): 2 g via INTRAVENOUS
  Filled 2013-06-30 (×8): qty 2000

## 2013-06-30 MED ORDER — DEXTROSE 5 % IV SOLN
500.0000 mg | INTRAVENOUS | Status: AC
  Administered 2013-06-30 – 2013-07-01 (×2): 500 mg via INTRAVENOUS
  Filled 2013-06-30 (×2): qty 500

## 2013-06-30 NOTE — Progress Notes (Signed)
RN reports + Amnisure

## 2013-06-30 NOTE — Progress Notes (Signed)
Amnisure test--Vaginal specimen obtained for verification of SROM.Marland Kitchen  Specimen sent to lab.

## 2013-06-30 NOTE — Progress Notes (Signed)
SCDs worn.

## 2013-06-30 NOTE — Progress Notes (Signed)
Accompanied Dr. Marcelle Overlie to pt.'s room.  Dr. Marcelle Overlie talked with pt. About POC.  Order given to obtain amnisure vaginal specimen for confirmation of srom.  Pt. Agreed with POC.

## 2013-06-30 NOTE — Progress Notes (Signed)
Patient ID: Erica Fields, female   DOB: 1976/10/16, 37 y.o.   MRN: [id number] [redacted]w[redacted]d  Had poss SROM last PM >>US showed nl AF + VTX  S// still having some leaking  O// BP 107/64  Pulse 86  Temp(Src) 98.6 F (37 C) (Oral)  Resp 20  Ht 5\' 7"  (1.702 m)  Wt 102.377 kg (225 lb 11.2 oz)  BMI 35.34 kg/m2  SpO2 98%  LMP 12/09/2012  abd soft, nontender, FHR 148  A+P// poss PPROM, will check Amnisure to verify/cont ABX

## 2013-07-01 NOTE — Progress Notes (Signed)
30 1/7 weeks  No leaking this AM, no UCs or bleeding  VSS Afeb FHT + accels UCs some irritability  Bedside U/S  vtx  A: PPROM with + amnisure  P: Continue latency antibiotics      S/P BMTZ      D/W patient above, magnesium sulfate if labors for nueroprotection

## 2013-07-01 NOTE — Progress Notes (Signed)
Dr. Henderson Cloud here to perform portal u/s on abd.  Fetus is vertex.  Discussed POC with pt.  Pt. Agrees with POC.

## 2013-07-02 MED ORDER — SODIUM CHLORIDE 0.9 % IJ SOLN
3.0000 mL | Freq: Two times a day (BID) | INTRAMUSCULAR | Status: DC
  Administered 2013-07-02 (×2): 3 mL via INTRAVENOUS

## 2013-07-02 MED ORDER — SODIUM CHLORIDE 0.9 % IJ SOLN: 3.0000 mL | Freq: Two times a day (BID) | INTRAMUSCULAR | Status: DC

## 2013-07-02 MED ORDER — SODIUM CHLORIDE 0.9 % IV SOLN: 250.0000 mL | INTRAVENOUS | Status: DC | PRN

## 2013-07-02 MED ORDER — SODIUM CHLORIDE 0.9 % IJ SOLN: 3.0000 mL | INTRAMUSCULAR | Status: DC | PRN

## 2013-07-02 NOTE — Progress Notes (Signed)
Problems: 1. PPROM at 30 weeks on antibiotics now on Day 1 of amoxicillin and Day 1 of po azithromax 2.  Advanced Cervical dilation  S: Patient notices leaking with going to the bathroom. She denies vaginal odor.  She reports some low back pain - not severe.  O: FHR is category 1 Tocometer irregular mild contractions Uterus is non tender  IMPRESSION: Advanced cervical dilation PPROM  PLAN: Continue antibiotics Completed steroid series Would not tocolyze if labor develop but strongly recommend Magnesium for neuroprotection Plan of care reviewed with the patient

## 2013-07-03 ENCOUNTER — Encounter (HOSPITAL_COMMUNITY): Payer: Self-pay | Admitting: *Deleted

## 2013-07-03 MED ORDER — BENZOCAINE-MENTHOL 20-0.5 % EX AERO
1.0000 "application " | INHALATION_SPRAY | CUTANEOUS | Status: DC | PRN
  Filled 2013-07-03: qty 56

## 2013-07-03 MED ORDER — MEDROXYPROGESTERONE ACETATE 150 MG/ML IM SUSP: 150.0000 mg | INTRAMUSCULAR | Status: DC | PRN

## 2013-07-03 MED ORDER — OXYCODONE-ACETAMINOPHEN 5-325 MG PO TABS: 1.0000 | ORAL_TABLET | ORAL | Status: DC | PRN

## 2013-07-03 MED ORDER — SIMETHICONE 80 MG PO CHEW: 80.0000 mg | CHEWABLE_TABLET | ORAL | Status: DC | PRN

## 2013-07-03 MED ORDER — DIPHENHYDRAMINE HCL 25 MG PO CAPS: 25.0000 mg | ORAL_CAPSULE | Freq: Four times a day (QID) | ORAL | Status: DC | PRN

## 2013-07-03 MED ORDER — TETANUS-DIPHTH-ACELL PERTUSSIS 5-2.5-18.5 LF-MCG/0.5 IM SUSP
0.5000 mL | Freq: Once | INTRAMUSCULAR | Status: DC
  Filled 2013-07-03: qty 0.5

## 2013-07-03 MED ORDER — LIDOCAINE HCL (PF) 1 % IJ SOLN
INTRAMUSCULAR | Status: AC
  Filled 2013-07-03: qty 30

## 2013-07-03 MED ORDER — MEASLES, MUMPS & RUBELLA VAC ~~LOC~~ INJ
0.5000 mL | INJECTION | Freq: Once | SUBCUTANEOUS | Status: DC
  Filled 2013-07-03: qty 0.5

## 2013-07-03 MED ORDER — IBUPROFEN 600 MG PO TABS
600.0000 mg | ORAL_TABLET | Freq: Four times a day (QID) | ORAL | Status: DC
  Filled 2013-07-03 (×3): qty 1

## 2013-07-03 MED ORDER — PRENATAL MULTIVITAMIN CH
1.0000 | ORAL_TABLET | Freq: Every day | ORAL | Status: DC
  Administered 2013-07-04: 1 via ORAL
  Filled 2013-07-03 (×2): qty 1

## 2013-07-03 MED ORDER — ZOLPIDEM TARTRATE 5 MG PO TABS: 5.0000 mg | ORAL_TABLET | Freq: Every evening | ORAL | Status: DC | PRN

## 2013-07-03 MED ORDER — MAGNESIUM SULFATE 40 G IN LACTATED RINGERS - SIMPLE
2.0000 g/h | INTRAVENOUS | Status: DC
  Administered 2013-07-03: 2 g/h via INTRAVENOUS
  Filled 2013-07-03: qty 500

## 2013-07-03 MED ORDER — BISACODYL 10 MG RE SUPP
10.0000 mg | Freq: Every day | RECTAL | Status: DC | PRN
  Filled 2013-07-03: qty 1

## 2013-07-03 MED ORDER — WITCH HAZEL-GLYCERIN EX PADS: 1.0000 "application " | MEDICATED_PAD | CUTANEOUS | Status: DC | PRN

## 2013-07-03 MED ORDER — SENNOSIDES-DOCUSATE SODIUM 8.6-50 MG PO TABS: 2.0000 | ORAL_TABLET | Freq: Every day | ORAL | Status: DC

## 2013-07-03 MED ORDER — LANOLIN HYDROUS EX OINT: TOPICAL_OINTMENT | CUTANEOUS | Status: DC | PRN

## 2013-07-03 MED ORDER — DIBUCAINE 1 % RE OINT
1.0000 "application " | TOPICAL_OINTMENT | RECTAL | Status: DC | PRN
  Filled 2013-07-03: qty 28

## 2013-07-03 MED ORDER — MAGNESIUM SULFATE BOLUS VIA INFUSION
4.0000 g | Freq: Once | INTRAVENOUS | Status: DC
  Filled 2013-07-03: qty 500

## 2013-07-03 MED ORDER — ONDANSETRON HCL 4 MG/2ML IJ SOLN: 4.0000 mg | INTRAMUSCULAR | Status: DC | PRN

## 2013-07-03 MED ORDER — OXYTOCIN 40 UNITS IN LACTATED RINGERS INFUSION - SIMPLE MED
INTRAVENOUS | Status: AC
  Administered 2013-07-03: 500 mL/h
  Filled 2013-07-03: qty 1000

## 2013-07-03 MED ORDER — FLEET ENEMA 7-19 GM/118ML RE ENEM: 1.0000 | ENEMA | Freq: Every day | RECTAL | Status: DC | PRN

## 2013-07-03 MED ORDER — ONDANSETRON HCL 4 MG PO TABS: 4.0000 mg | ORAL_TABLET | ORAL | Status: DC | PRN

## 2013-07-03 NOTE — Consult Note (Signed)
Neonatology Note:   Attendance at Delivery:    I was asked by Dr. Vincente Poli to attend this NSVD at 30 3/7 weeks following onset of preterm labor. The mother is a G5P2A2L1 A pos, GBS neg with AMA and history of IUFD at 38 weeks and prior preterm delivery at (?)22 weeks. She was admitted to the hospital at 26 2/7 weeks in preterm labor and received Betamethasone times 2. There was SROM 4 days prior to delivery, fluid clear. She was managed expectantly and received antibiotics, remaining afebrile. Shortly before delivery, the mother passed several clots and a placental abruption was found after delivery. There was a foul smell and the baby felt subjectively warm at delivery. He was vigorous with good spontaneous cry and tone. Needed only minimal bulb suctioning. Ap 9/9. Lungs clear to ausc in DR. Pulse oximeter was placed and showed an O2 saturation of 90-95% in room air at 5 minutes of age. The baby was seen briefly by his mother, then was transported to the NICU in room air. The mother's godsister, her support person, was in attendance.   Doretha Sou, MD

## 2013-07-03 NOTE — Progress Notes (Signed)
S:  No complaints. Good Fetal movement. Occasional contractions.  O; Afebrile VSS General alert and oriented Lung CTAB Car RRR Abdomen is soft and non tender  IMPRESSION: IUP at 30 w 3 days PPROM  PLAN: Complete 1 week of antibiotics Status post steroids Would administer Magnesium for neuroprotection if delivery imminent

## 2013-07-03 NOTE — Progress Notes (Signed)
Called by the nurse earlier. Patient experienced bright red bleeding consistent with bloody show. Exam by nurse 4 cm /100% vertex. I instructed her to transfer to L and D and to start Magnesium infusion for neuroprotection.   She is now in Room 160. Declines epidural. Second exam 4 to 5 cm dilated Recommend NICU at delivery  Plan of care reviewed with patient.

## 2013-07-04 LAB — CBC
Hemoglobin: 9.4 g/dL — ABNORMAL LOW (ref 12.0–15.0)
RBC: 3.6 MIL/uL — ABNORMAL LOW (ref 3.87–5.11)

## 2013-07-04 NOTE — Progress Notes (Signed)
Post Partum Day 1  Subjective: no complaints, up ad lib, voiding, tolerating PO and + flatus Baby doing very well in the NICU Mom is pumping  Objective: Blood pressure 128/64, pulse 81, temperature 98 F (36.7 C), temperature source Oral, resp. rate 18, height 5\' 7"  (1.702 m), weight 104.554 kg (230 lb 8 oz), last menstrual period 12/09/2012, SpO2 100.00%, unknown if currently breastfeeding.  Physical Exam:  General: alert, cooperative and appears stated age Lochia: appropriate Uterine Fundus: firm Incision: healing well, no significant drainage, no dehiscence DVT Evaluation: No evidence of DVT seen on physical exam.   Recent Labs  07/04/13 0544  HGB 9.4*  HCT 27.0*    Assessment/Plan: Plan for discharge tomorrow   LOS: 30 days   Makena Mcgrady L 07/04/2013, 7:39 AM

## 2013-07-05 ENCOUNTER — Encounter (HOSPITAL_COMMUNITY): Payer: Self-pay | Admitting: *Deleted

## 2013-07-05 NOTE — Progress Notes (Signed)
Post Partum Day 2 Subjective: no complaints, up ad lib, voiding, tolerating PO and + flatus.  Would like discharge tomorrow.  Objective: Blood pressure 116/72, pulse 76, temperature 98 F (36.7 C), temperature source Oral, resp. rate 18, height 5\' 7"  (1.702 m), weight 104.554 kg (230 lb 8 oz), last menstrual period 12/09/2012, SpO2 100.00%, unknown if currently breastfeeding.  Physical Exam:  General: alert, cooperative and appears stated age Lochia: appropriate Uterine Fundus: firm Incision: n/a  DVT Evaluation: No evidence of DVT seen on physical exam. Negative Homan's sign. No cords or calf tenderness.   Recent Labs  07/04/13 0544  HGB 9.4*  HCT 27.0*    Assessment/Plan: Discharge home and Breastfeeding Will consult case management to see if able to stay until PPD#3 since baby in NICU   LOS: 31 days   Sundee Garland 07/05/2013, 8:29 AM

## 2013-07-05 NOTE — Progress Notes (Signed)
Ur chart review completed.  

## 2013-07-05 NOTE — Lactation Note (Signed)
[name]. Lactation Consultation Note    Brief follow up consult with this mom of a NICU baby, now 39 hours post partum, and 30 5/7 weeks corrected gestation. Mom is doing well with pumping, and is expressing 5- 7 mls of colsotrum every 3 hours. I will follow this family in the NICU  Patient Name: Erica Shavonn Convey [id number] Date: 07/05/2013     Maternal Data    Feeding Feeding Type: Breast Milk Feeding method: Tube/Gavage  LATCH Score/Interventions                      Lactation Tools Discussed/Used     Consult Status      Alfred Levins 07/05/2013, 5:01 PM

## 2013-07-06 ENCOUNTER — Ambulatory Visit: Payer: Self-pay

## 2013-07-06 LAB — RPR: RPR Ser Ql: NONREACTIVE

## 2013-07-06 NOTE — Progress Notes (Signed)
Pt ambulated out teaching complete  

## 2013-07-06 NOTE — Progress Notes (Deleted)
Patient ID: Erica Fields, female   DOB: 12-05-1976, 37 y.o.   MRN: [id number] S: MUCH BETTER O: AF VSS      ABD SOFT      MINIMAL BL;EEDING      HGB STABLE A:  POST OP HEMATOMA CUFF CELLULITIS P: D/C HOME ON AUGMENTIN

## 2013-07-06 NOTE — Discharge Summary (Signed)
Obstetric Discharge Summary Reason for Admission: preterm contractions Prenatal Procedures: ultrasound Intrapartum Procedures: spontaneous vaginal delivery Postpartum Procedures: none Complications-Operative and Postpartum: none Hemoglobin  Date Value Range Status  07/04/2013 9.4* 12.0 - 15.0 g/dL Final     HCT  Date Value Range Status  07/04/2013 27.0* 36.0 - 46.0 % Final    Physical Exam:  General: alert and cooperative Lochia: appropriate Uterine Fundus: firm Incision: perineum intact DVT Evaluation: No evidence of DVT seen on physical exam. Negative Homan's sign. No cords or calf tenderness. No significant calf/ankle edema.  Discharge Diagnoses: preterm vaginal delivery  Discharge Information: Date: 07/06/2013 Activity: pelvic rest Diet: routine Medications: PNV Condition: stable Instructions: refer to practice specific booklet Discharge to: home   Newborn Data: Live born female  Birth Weight: 3 lb 9.5 oz (1630 g) APGAR: 9, 9  Home with NICU.  Irl Bodie G 07/06/2013, 8:24 AM

## 2013-07-06 NOTE — Discharge Summary (Signed)
Erica Fields, Erica Fields             ACCOUNT NO.:  [id number]  MEDICAL RECORD NO.:  [id number]  LOCATION:  9320                          FACILITY:  [facility]  PHYSICIAN:  Juluis Mire, M.D.   DATE OF BIRTH:  Dec 11, 1976  DATE OF ADMISSION:  06/04/2013 DATE OF DISCHARGE:  07/06/2013                              DISCHARGE SUMMARY   ADMITTING DIAGNOSIS:  Postoperative hematoma with cuff cellulitis.  DISCHARGE DIAGNOSIS:  Postoperative hematoma with cuff cellulitis.  For complete history and physical, please see dictated note.  COURSE IN THE HOSPITAL:  The patient brought and begun on Unasyn.  She had defervesced of temperature and lower in her white count.  Her hemoglobin was fairly stable at 9.4 and a white count decreased, last 1 was 12.2.  She improved remarkably in terms of her symptomatology including the pressure sensation that she was admitted with.  On July 06, 2013, she had been afebrile for over 24 hours.  She is feeling much better, tolerating a diet.  She was ambulating without difficulty.  No trouble with urination or bowel function.  She will be discharged home at this time.  Follow up in the office tomorrow.  In terms of complications, none were encountered during her stay in the hospital.  Discharged home in stable condition.  DISPOSITION:  Postop instructions were again given.  She will be placed on a course of Augmentin and again follow up.     Juluis Mire, M.D.     JSM/MEDQ  D:  07/06/2013  T:  07/06/2013  Job:  [id number]

## 2013-07-06 NOTE — Lactation Note (Signed)
[name]. Lactation Consultation Note   Follow up brief consult with this mom today. She is now 66 hours post partum, and is expressing about 15 mls every 3 hours. Mom reports no breast discomfort. She is getting a DEP from [facility]. I advised her to pump up to 30 minutes now that she is transitioning into mature milk. Mom knows to call for questions/concerns. i will follow this family in the NICU.  Patient Name: Erica Fields [id number] Date: 07/06/2013 Reason for consult: Follow-up assessment;NICU baby   Maternal Data    Feeding Feeding Type: Breast Milk with Formula added Feeding method: Tube/Gavage Length of feed: 20 min  LATCH Score/Interventions                      Lactation Tools Discussed/Used WIC Program: Yes (mom getting DEP today)   Consult Status Consult Status: PRN Follow-up type:  (in NICU)    Erica Fields 07/06/2013, 2:00 PM

## 2013-08-17 ENCOUNTER — Ambulatory Visit: Payer: Self-pay

## 2013-08-17 NOTE — Lactation Note (Signed)
[name]. Lactation Consultation Note    Brief follow up consult with this mom and baby, now 37 weeks old, and 36 6/7 weeks corrected gestation. Mom is interested and trying to breast feed, and would like some help with latching. I told her I would love to help her, and for her to let me know by having her baby's nurse call me.   Patient Name: Erica Aedyn Mckeon [id number] Date: 08/17/2013 Reason for consult: Follow-up assessment;NICU baby   Maternal Data    Feeding    LATCH Score/Interventions                      Lactation Tools Discussed/Used     Consult Status Consult Status: Follow-up Follow-up type:  (in NICU)    Alfred Levins 08/17/2013, 4:38 PM

## 2014-10-31 ENCOUNTER — Encounter (HOSPITAL_COMMUNITY): Payer: Self-pay | Admitting: *Deleted

## 2015-01-04 IMAGING — US US FETAL BPP W/O NONSTRESS
1 series · 13 of 23 positions shown · non-contrast
Comparison: none

[Series 1: us fetal bpp w/o nonstress · non-contrast · 23 acquisitions, 13 frames shown]
[im 1/23]
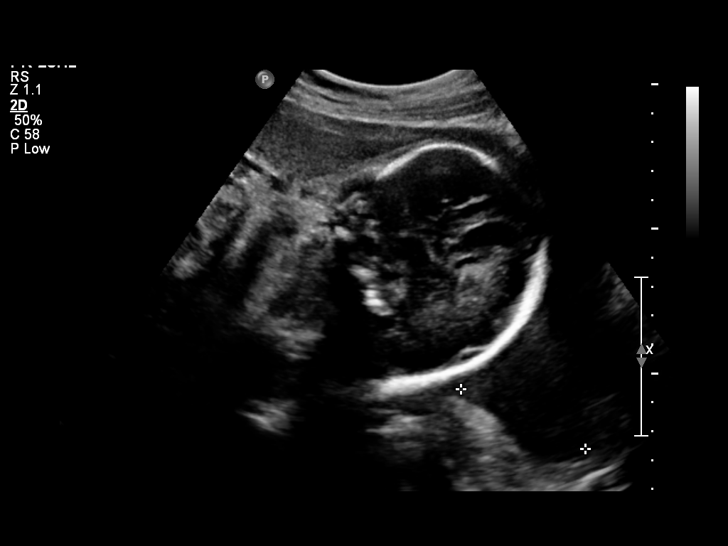
[im 3/23]
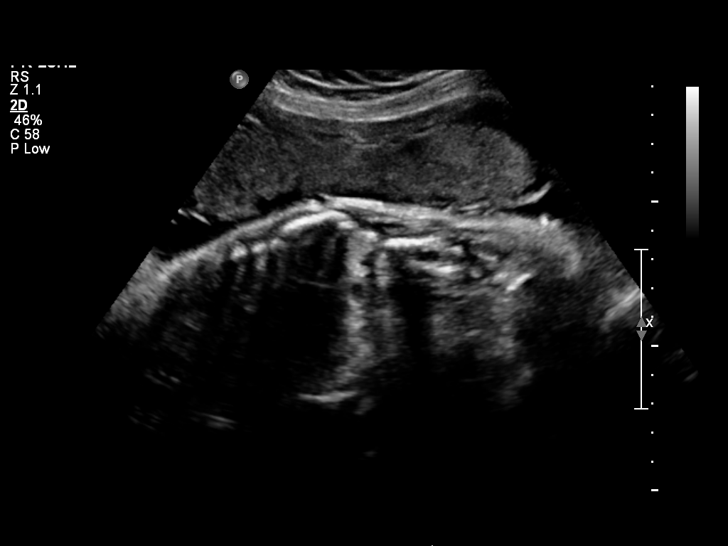
[im 5/23]
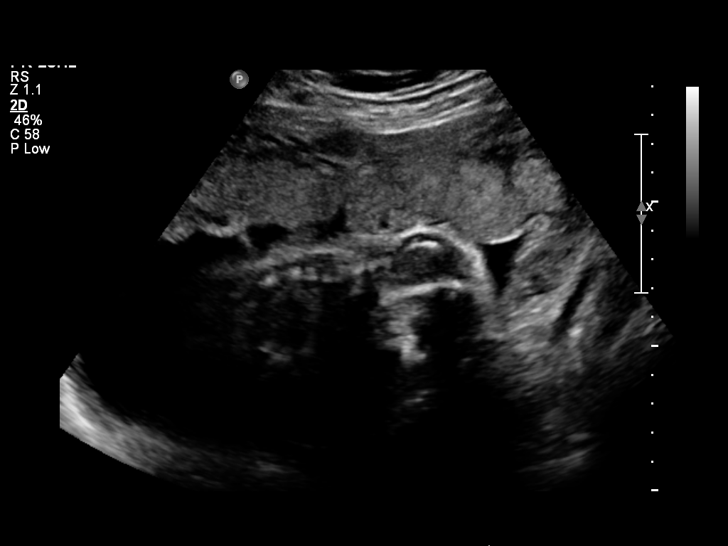
[im 7/23]
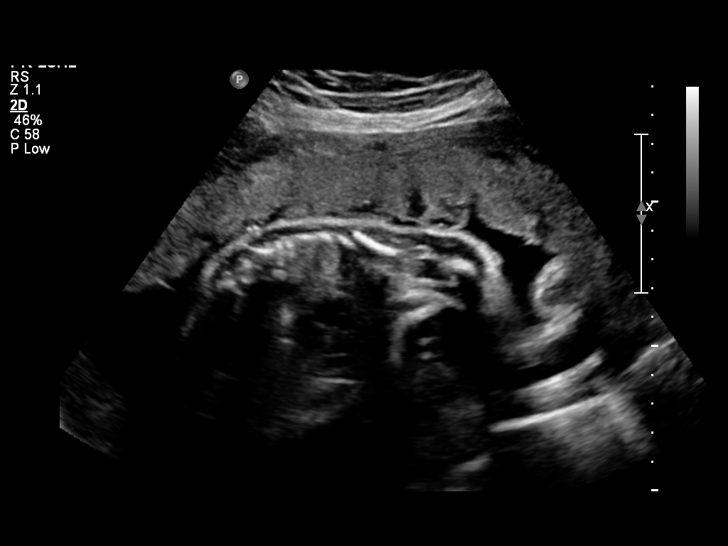
[im 8/23]
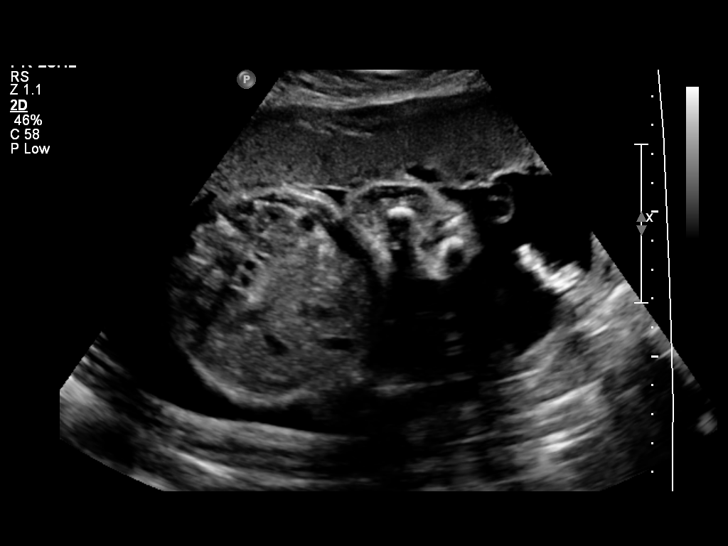
[im 10/23]
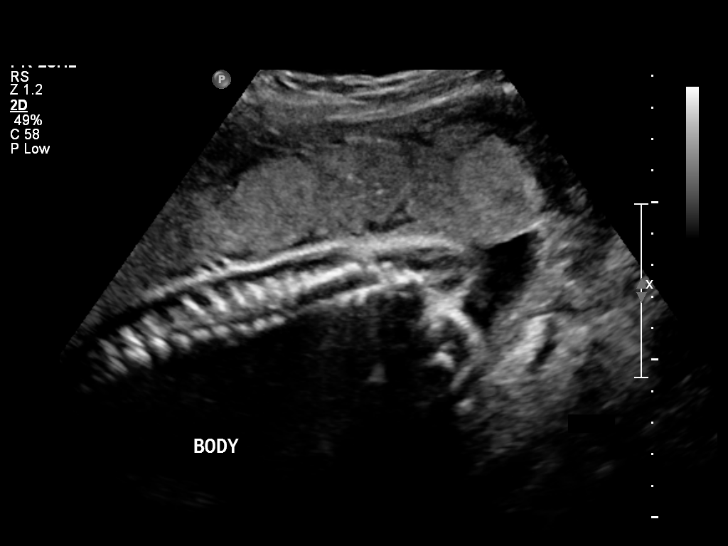
[im 12/23]
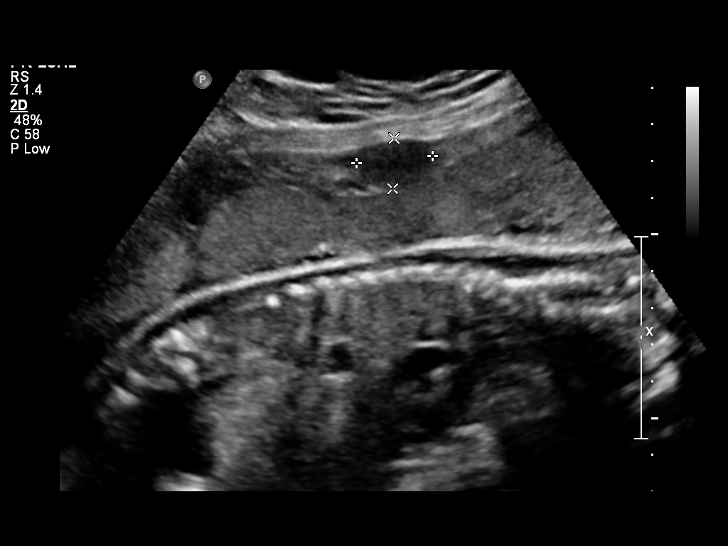
[im 14/23]
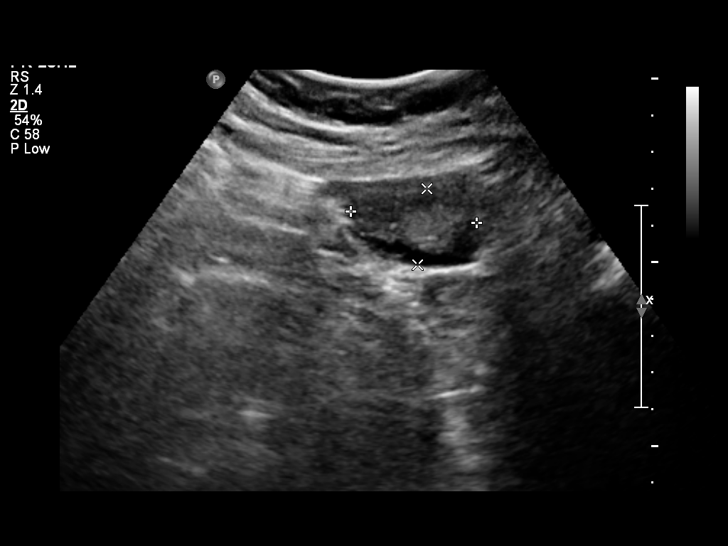
[im 16/23]
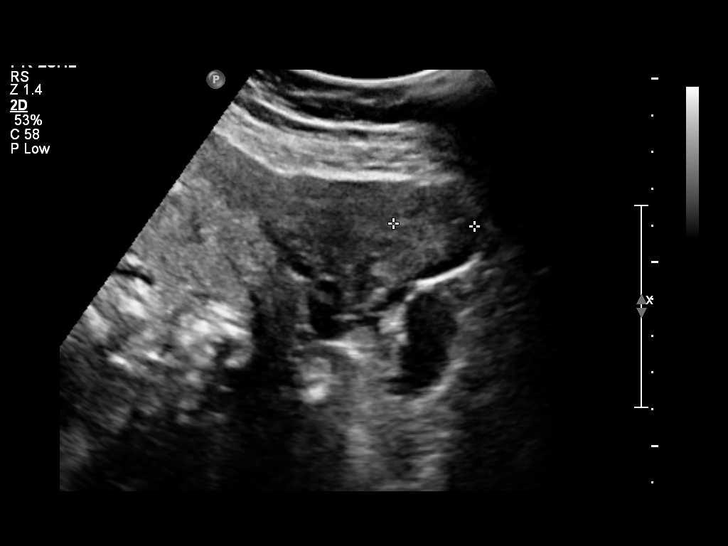
[im 17/23]
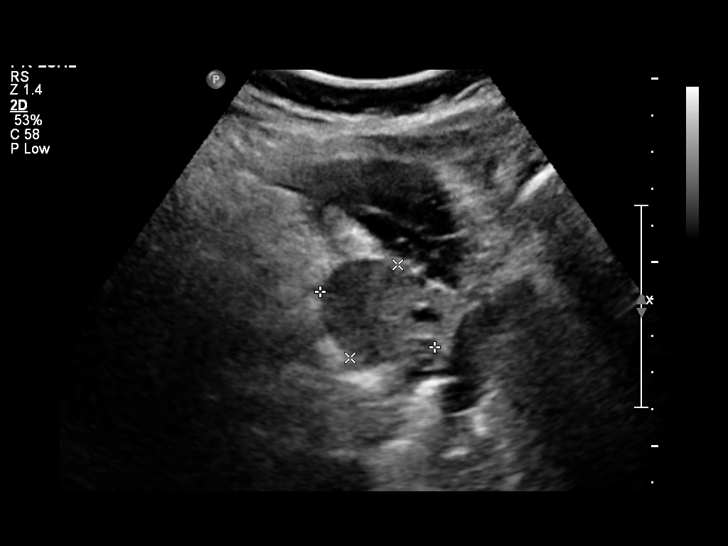
[im 19/23]
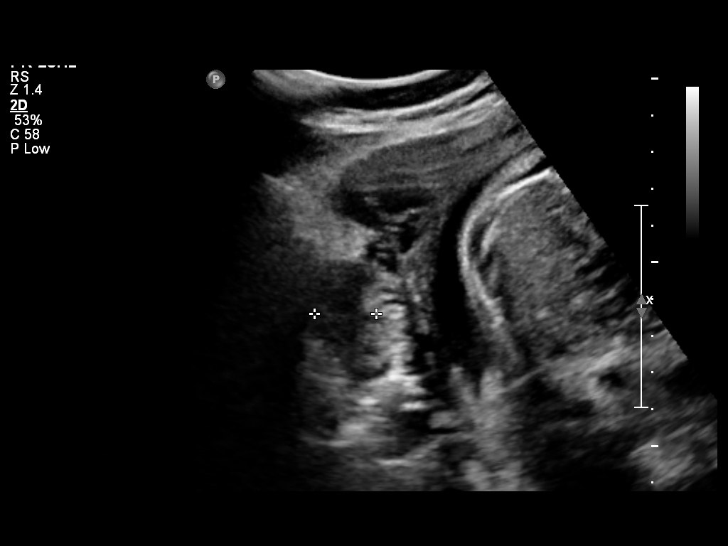
[im 21/23]
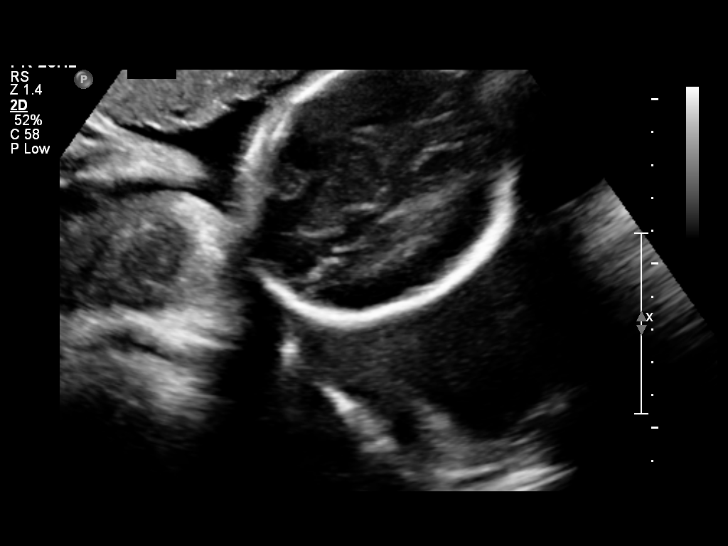
[im 23/23]
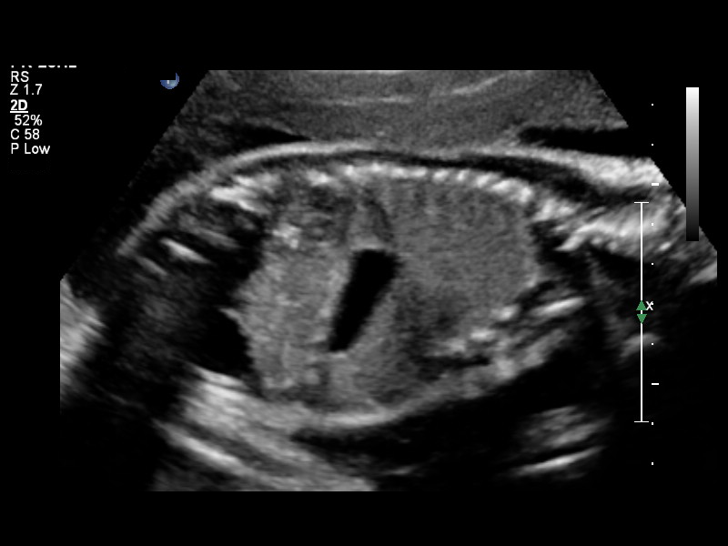

[13 of 23 positions shown; findings below may reference images not displayed]

OBSTETRICS REPORT
                      (Signed Final 06/14/2013 [DATE])

Service(s) Provided

Indications

 Non-reactive NST
 Cervical insufficiency
Fetal Evaluation

 Num Of Fetuses:    1
 Fetal Heart Rate:  153                         bpm
 Cardiac Activity:  Observed
 Presentation:      Cephalic
 Placenta:          Anterior, above cervical os

 Amniotic Fluid
 AFI FV:      Subjectively within normal limits
                                             Larg Pckt:     6.1  cm
Biophysical Evaluation

 Amniotic F.V:   Within normal limits       F. Tone:        Observed
 F. Movement:    Observed                   N.S.T:          Nonreactive
 F. Breathing:   Observed                   Score:          [DATE]
Gestational Age

 Best:          27w 5d    Det. By:   Previous Ultrasound      EDD:   09/08/13
Cervix Uterus Adnexa

 Cervical Length:   0         cm       Funnel Width:   4.5       cm
 Funnel Length:     4.8       cm

 Cervix:       Appears funnelled, see comments.

 Left Ovary:   Within normal limits.
 Right Ovary:  Within normal limits.
Myomas

 Site                     L(cm)      W(cm)      D(cm)       Location
 Anterior                 2.3        2.1        1.4         Subserosal
 Blood Flow                  RI       PI       Comments

Impression

 Single living IUP with assigned GA of 27w 5d in cephalic
 position.
 BPP is [DATE].
 The cervix is dilated with funneling. There is no measurable
 closed cervical length.
 Uterine fibroid, as described above.

## 2015-01-05 IMAGING — US US OB DETAIL+14 WK
1 series · 12 of 28 positions shown · non-contrast
Comparison: none

OBSTETRICS REPORT
                      (Signed Final 06/15/2013 [DATE])

Service(s) Provided
 US OB DETAIL + 14 WK                                  76811.0
Indications
 Detailed fetal anatomic survey
 Cervical insufficiency
 Poor obstetric history: Previous preterm delivery
 (1lb 12oz, living)
 Poor obstetric history: Previous IUFD (38 weeks)
 Advanced maternal age (AMA), Multigravida - low
 risk AaterniD4R
 Uterine fibroid
Fetal Evaluation
 Num Of Fetuses:    1
 Cardiac Activity:  Observed
 Presentation:      Cephalic
 Placenta:          Anterior, above cervical os
 P. Cord            Not well visualized
 Insertion:
 Amniotic Fluid
 AFI FV:      Subjectively within normal limits
                                             Larg Pckt:     5.5  cm
Biometry
 BPD:     70.1  mm     G. Age:  28w 1d                CI:         79.4   70 - 86
 OFD:     88.3  mm                                    FL/HC:      21.0   18.8 -
 HC:     255.7  mm     G. Age:  27w 5d       19  %    HC/AC:      1.06   1.05 -
 AC:     241.8  mm     G. Age:  28w 3d       60  %    FL/BPD:     76.5   71 - 87
 FL:      53.6  mm     G. Age:  28w 3d       52  %    FL/AC:      22.2   20 - 24
 HUM:     46.2  mm     G. Age:  27w 2d       33  %
 Est. FW:    9198  gm    2 lb 11 oz      61  %
Gestational Age
 LMP:           26w 6d        Date:  12/09/12                 EDD:   09/15/13
 U/S Today:     28w 1d                                        EDD:   09/06/13
 Best:          27w 6d     Det. By:  Early Ultrasound         EDD:   09/08/13
                                     (03/03/13)
Anatomy
 Cranium:          Appears normal         Aortic Arch:      Not well visualized
 Fetal Cavum:      Not well visualized    Ductal Arch:      Not well visualized
 Ventricles:       Appears normal         Diaphragm:        Appears normal
 Choroid Plexus:   Appears normal         Stomach:          Appears normal, left
                                                            sided
 Cerebellum:       Not well visualized    Abdomen:          Appears normal
 Posterior Fossa:  Not well visualized    Abdominal Wall:   Not well visualized
 Nuchal Fold:      Not applicable (>20    Cord Vessels:     Appears normal (3
                   wks GA)                                  vessel cord)
 Face:             Appears normal         Kidneys:          Appear normal
                   (orbits and profile)
 Lips:             Appears normal         Bladder:          Appears normal
 Palate:           Not well visualized    Spine:            Not well visualized
 Heart:            Appears normal         Lower             Appears normal
                   (4CH, axis, and        Extremities:
                   situs)
 RVOT:             Appears normal         Upper             Appears normal
                                          Extremities:
 LVOT:             Appears normal
 Other:  Parents do not wish to know sex of fetus. Rt. Heel and 5th digit
         visualized. Technically difficult due to fetal position.
Impression
INDICATION: 36 yr old FNBVVVV at 23w9d with prior IUFD at 38
 weeks, prior preterm delivery at 23-26 weeks, and now with
 preterm cervical dilation for fetal ultrasound.

[Series 1: us ob detail+14 wk · 0.28mm/px · 12 of 62 slices shown]
[im 3/62]
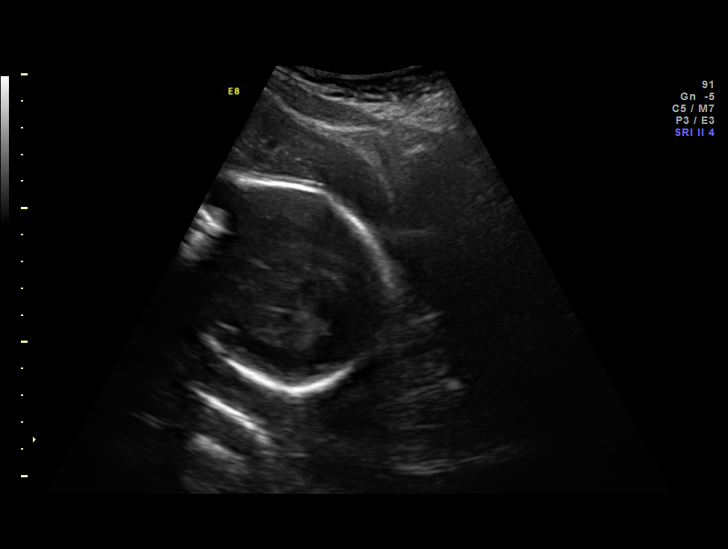
[im 7/62]
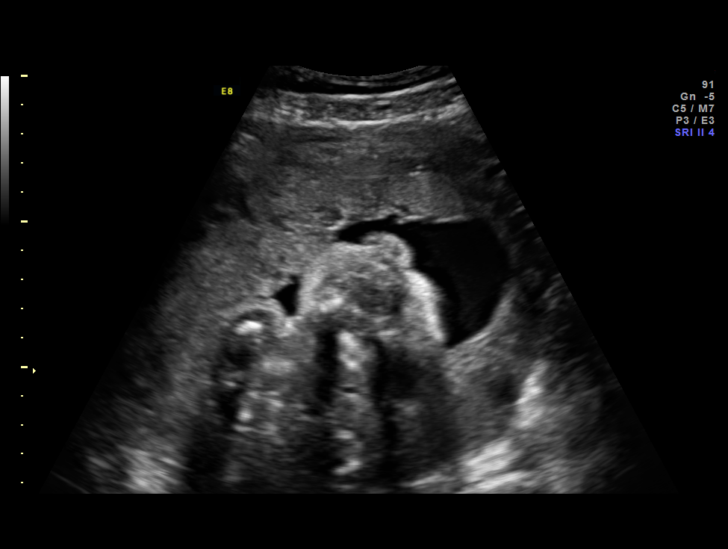
[im 12/62]
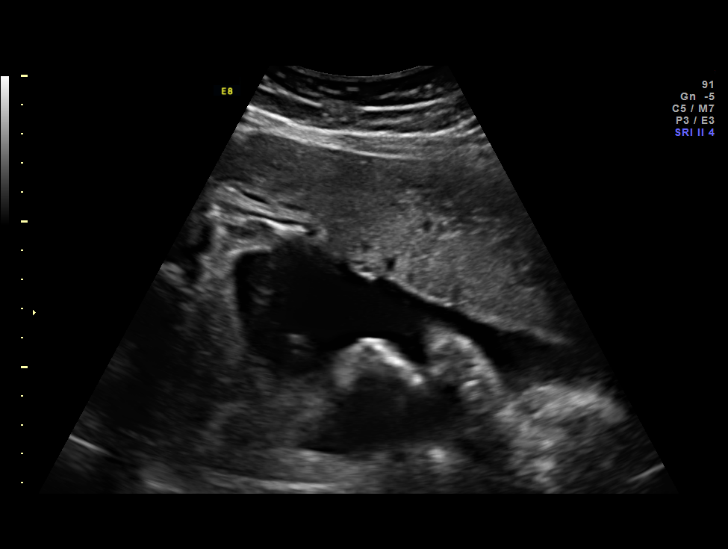
[im 19/62]
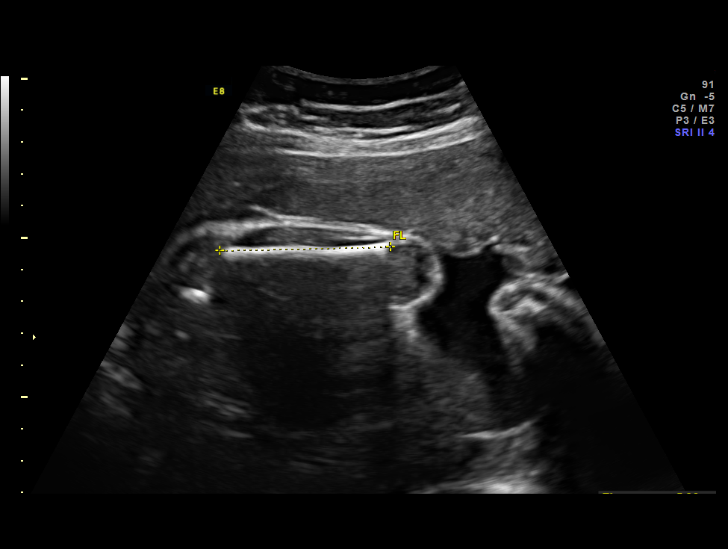
[im 23/62]
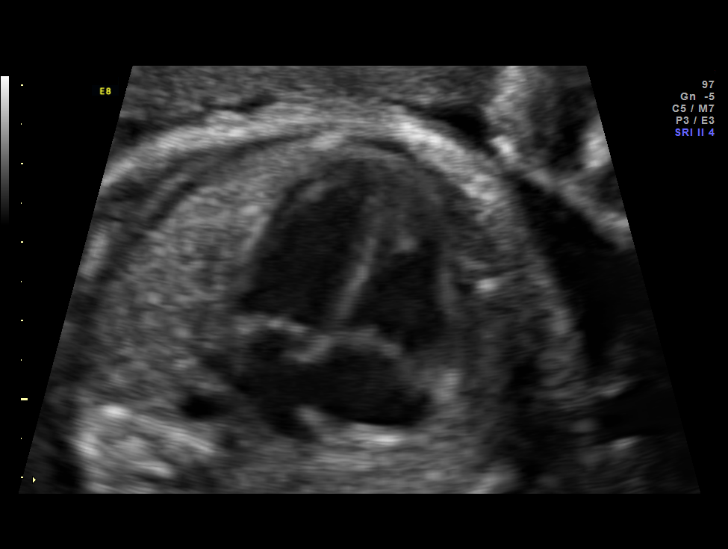
[im 28/62]
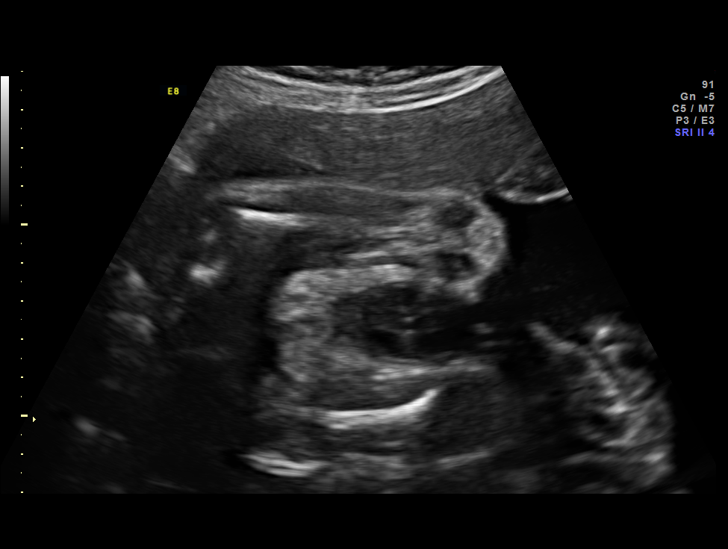
[im 34/62]
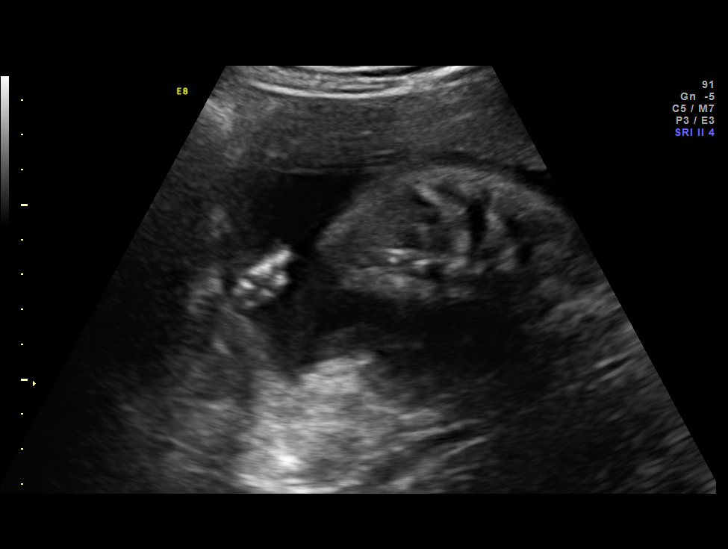
[im 39/62]
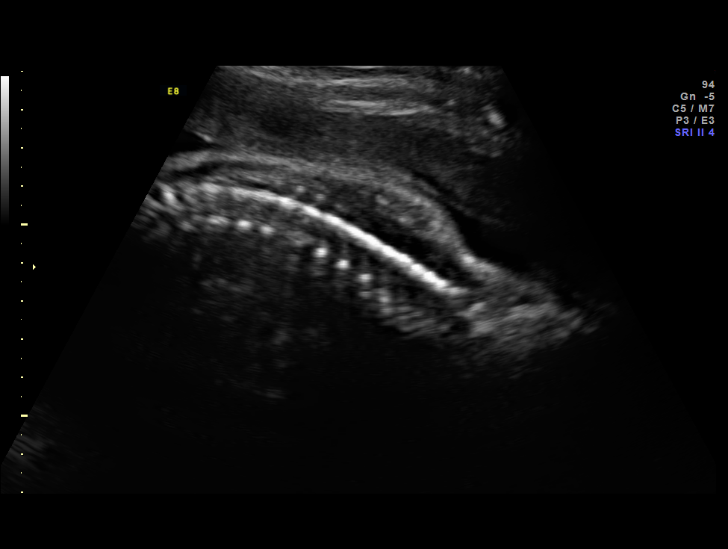
[im 43/62]
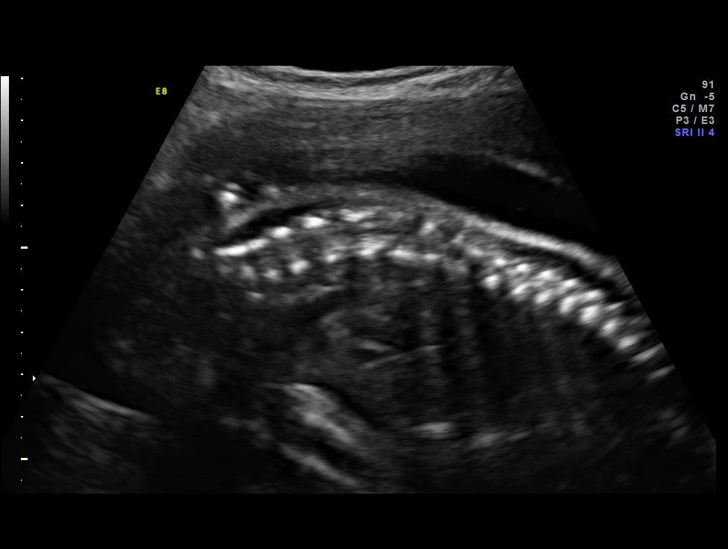
[im 50/62]
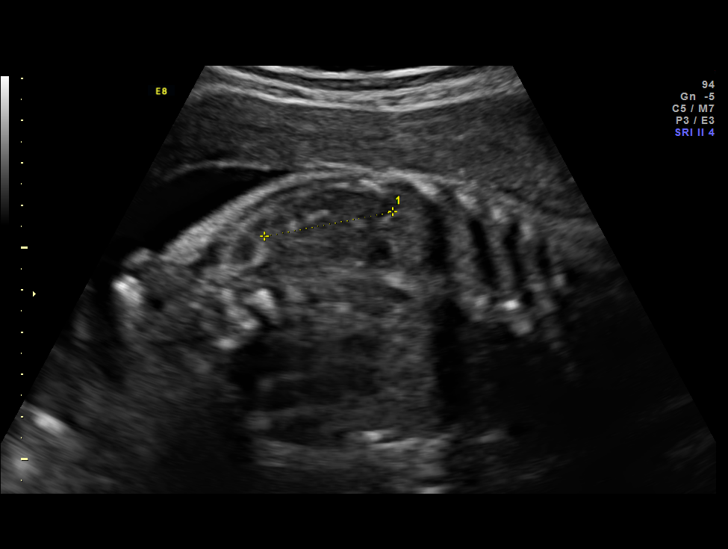
[im 55/62]
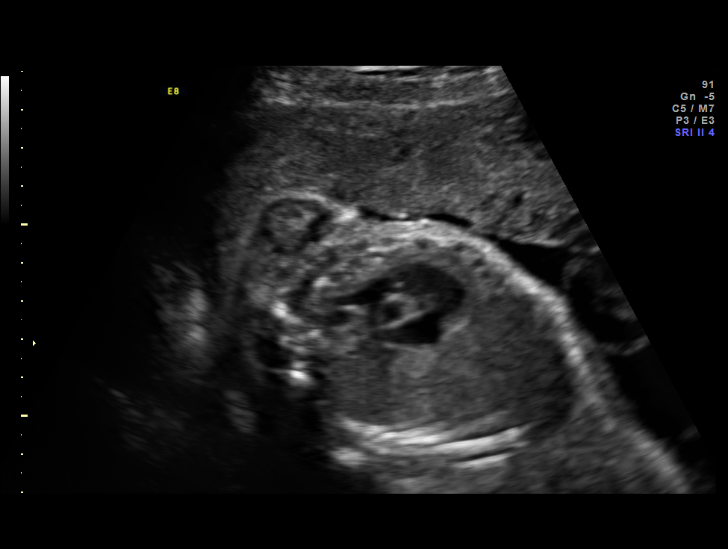
[im 59/62]
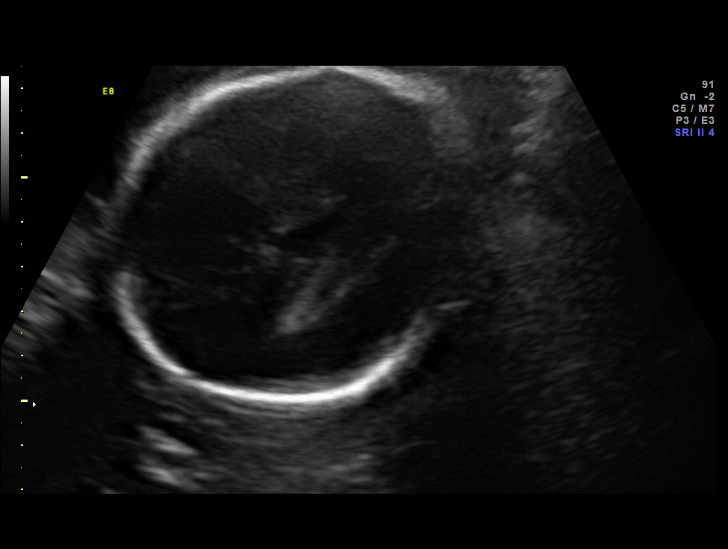

[12 of 28 positions shown; findings below may reference images not displayed]

FINDINGS: 1. Single intrauterine pregnancy.
 2. Estimated fetal weight is in the 61st%.
 3. Anterior placenta without evidence of previa.
 4. Normal amniotic fluid volume.
 5. The limited anatomy survey is normal.
 6. The fetus is in cephalic presentation.
Recommendations

 1. Appropriate fetal growth.
 2. Normal limited anatomy survey.
 3. Advanced maternal age:
 - previously counseled
 - had normal AaterniD4R screening
 4. Previous IUFD:
 - see consult letter
 5. Previous preterm delivery:
 - see consult letter
 6. Preterm cervical dilation:
 - see consult letter
 - Hersi Abdillahi/Woodrow Simeone

## 2015-09-27 ENCOUNTER — Ambulatory Visit: Payer: Medicaid Other | Admitting: Obstetrics

## 2017-04-05 ENCOUNTER — Emergency Department (HOSPITAL_COMMUNITY)
Admission: EM | Admit: 2017-04-05 | Discharge: 2017-04-05 | Disposition: A | Discharge status: Home / Self Care | Payer: Medicaid Other | Attending: Physician Assistant | Admitting: Physician Assistant

## 2017-04-05 ENCOUNTER — Encounter (HOSPITAL_COMMUNITY): Payer: Self-pay | Admitting: *Deleted

## 2017-04-05 DIAGNOSIS — Z79899 Other long term (current) drug therapy: Secondary | ICD-10-CM | POA: Insufficient documentation

## 2017-04-05 DIAGNOSIS — J029 Acute pharyngitis, unspecified: Secondary | ICD-10-CM | POA: Insufficient documentation

## 2017-04-05 DIAGNOSIS — H9202 Otalgia, left ear: Secondary | ICD-10-CM | POA: Diagnosis not present

## 2017-04-05 NOTE — ED Triage Notes (Signed)
Pt complains of ringing in ears, sore throat, sinus pressure since last night. Pt denies nasal congestion or cough.

## 2017-04-05 NOTE — Discharge Instructions (Signed)
Please follow up with your primary care provider within one week regarding today's visit. Please continue warm salt water rinses frequently drop a day. Take Tylenol or ibuprofen as needed for pain.  Get help right away if: Your neck becomes stiff. You drool or are unable to swallow liquids. You vomit or are unable to keep medicines or liquids down. You have severe pain that does not go away with the use of recommended medicines. You have trouble breathing (not caused by a stuffy nose).  Get help right away if: You have a severe headache. You have a stiff neck. You have trouble swallowing. You have redness or swelling behind your ear. You have fluid or blood coming from your ear. You have hearing loss. You feel dizzy.

## 2017-04-05 NOTE — ED Provider Notes (Signed)
[facility]-EMERGENCY DEPT Provider Note   CSN: [id number] Arrival date & time: 04/05/17  1831  By signing my name below, I, Erica Fields, attest that this documentation has been prepared under the direction and in the presence of  Erica Fields, [place]. Electronically Signed: Doreatha Fields, ED Scribe. 04/05/17. 8:15 PM.    History   Chief Complaint Chief Complaint  Patient presents with  . Sore Throat  . Otalgia    HPI Erica Fields is a 41 y.o. female who presents to the Emergency Department complaining of moderate, gradually worsening sore throat that began last night with associated bilateral ear pain (L>R). She describes her left ear pain as pressure and also reports decreased hearing on the left. Per pt, her right ear pain has been improving. She states her sore throat is worsened with swallowing. Pt reports she has tried salt water rinses with no relief. No treatments tried PTA for ear pain. No h/o of similar symptoms. No known sick contacts with similar symptoms, recent URI. She states she was experiencing tinnitus initially, but this has now resolved and she denies any currently. Pt denies difficulty with coordination, fever, chills, nausea, vomiting, diarrhea, nasal congestion, cough, SOB, chest pain, difficulty swallowing or tolerating secretions, fever, chills, dizziness, decreased hearing on the right side. Per pt, her PCP's office was closed today.   The history is provided by the patient. No language interpreter was used.    Past Medical History:  Diagnosis Date  . Preterm labor     There are no active problems to display for this patient.   Past Surgical History:  Procedure Laterality Date  . NO PAST SURGERIES      OB History    Gravida Para Term Preterm AB Living   SAB TAB Ectopic Multiple Live Births           1       Home Medications    Prior to Admission medications   Medication Sig Start Date End Date Taking? Authorizing Provider  Prenatal  Vit-Fe Fumarate-FA (PRENATAL MULTIVITAMIN) TABS Take 1 tablet by mouth every morning.    Historical Provider, MD  psyllium (METAMUCIL) 58.6 % packet Take 1 packet by mouth as needed (constipation).    Historical Provider, MD    Family History Family History  Problem Relation Age of Onset  . Hypertension Other   . Diabetes Other   . CAD Other     Social History Social History  Substance Use Topics  . Smoking status: Never Smoker  . Smokeless tobacco: Never Used  . Alcohol use No     Allergies   Patient has no known allergies.   Review of Systems Review of Systems  Constitutional: Negative for fever.  HENT: Positive for ear pain and sore throat. Negative for trouble swallowing.     Physical Exam Updated Vital Signs BP 136/90 (BP Location: Left Arm)   Pulse (!) 104   Temp 98.2 F (36.8 C) (Oral)   Resp 18   Wt 93 kg   LMP 03/22/2017   SpO2 100%   BMI 32.11 kg/m   Physical Exam  Constitutional: She is oriented to person, place, and time. She appears well-developed and well-nourished.  Well appearing  HENT:  Head: Normocephalic and atraumatic.  Right Ear: External ear normal.  Left Ear: External ear normal.  Nose: Nose normal.  Mouth/Throat: Oropharynx is clear and moist. No oropharyngeal exudate.  Oropharynx without evidence of redness or  exudates. Tonsils without evidence of redness, swelling, or exudates. TM's appear normal with no evidence of bulging. EAC appear non erythematous and not swollen. Mastoid process without redness, swelling, tenderness bilaterally.  Eyes: Conjunctivae and EOM are normal. Pupils are equal, round, and reactive to light.  Neck: Normal range of motion.  Normal ROM. No nuchal rigidity.   Cardiovascular: Normal rate and normal heart sounds.   Pulmonary/Chest: Effort normal and breath sounds normal. No respiratory distress. She has no wheezes. She has no rales.  Normal work of breathing. No respiratory distress noted.   Abdominal:    Soft and nontender. No rebound. No guarding. Negative murphy's sign. No focal tenderness at McBurney's point. No CVA tenderness. No evidence of hernia  Musculoskeletal: Normal range of motion.  Lymphadenopathy:    She has no cervical adenopathy.  Neurological: She is alert and oriented to person, place, and time.  Skin: Skin is warm.  Psychiatric: She has a normal mood and affect. Her behavior is normal.  Nursing note and vitals reviewed.    ED Treatments / Results   DIAGNOSTIC STUDIES: Oxygen Saturation is 100% on RA, normal by my interpretation.    COORDINATION OF CARE: 8:10 PM Discussed treatment plan with pt at bedside which includes PCP f/u and pt agreed to plan.    Labs (all labs ordered are listed, but only abnormal results are displayed) Labs Reviewed - No data to display  EKG  EKG Interpretation None       Radiology No results found.  Procedures Procedures (including critical care time)  Medications Ordered in ED Medications - No data to display   Initial Impression / Assessment and Plan / ED Course  I have reviewed the triage vital signs and the nursing notes.  Pertinent labs & imaging results that were available during my care of the patient were reviewed by me and considered in my medical decision making (see chart for details).     [name] presents to the ED for evaluation of throat pain and ear pain, likely viral. Pt is tolerating secretions. Presentation not concerning for peritonsillar abscess or spread of infection to deep spaces of the throat; patent airway. No concern for acute mastoiditis, meningitis. Conservative therapies discussed and recommended, including warm salt water rinses and ibuprofen/Tylenol for pain. Advised patient that her symptoms do not look infectious not requiring antibiotics at this time. Patient advised to follow up with PCP within the week. Patient appears stable for discharge at this time. Return precautions  discussed and outlined in discharge paperwork. Patient is agreeable to plan.     Final Clinical Impressions(s) / ED Diagnoses   Final diagnoses:  Pharyngitis, unspecified etiology  Left ear pain    New Prescriptions New Prescriptions   No medications on file    I personally performed the services described in this documentation, which was scribed in my presence. The recorded information has been reviewed and is accurate.   [address] [place], [place] 04/05/17 2042    Courteney Randall An, MD 04/05/17 2337

## 2018-05-13 ENCOUNTER — Ambulatory Visit: Payer: Medicaid Other | Admitting: Diagnostic Neuroimaging

## 2021-03-06 ENCOUNTER — Ambulatory Visit
Admission: EM | Admit: 2021-03-06 | Discharge: 2021-03-06 | Disposition: A | Discharge status: Home / Self Care | Payer: Medicaid Other | Attending: Family Medicine | Admitting: Family Medicine

## 2021-03-06 ENCOUNTER — Other Ambulatory Visit: Payer: Self-pay

## 2021-03-06 DIAGNOSIS — B029 Zoster without complications: Secondary | ICD-10-CM

## 2021-03-06 DIAGNOSIS — R21 Rash and other nonspecific skin eruption: Secondary | ICD-10-CM

## 2021-03-06 MED ORDER — TRIAMCINOLONE ACETONIDE 0.1 % EX CREA: 1.0000 "application " | TOPICAL_CREAM | Freq: Two times a day (BID) | CUTANEOUS | 0 refills | Status: AC

## 2021-03-06 MED ORDER — VALACYCLOVIR HCL 1 G PO TABS: 1000.0000 mg | ORAL_TABLET | Freq: Three times a day (TID) | ORAL | 0 refills | Status: AC

## 2021-03-06 MED ORDER — TRIAMCINOLONE ACETONIDE 0.1 % EX CREA: 1.0000 "application " | TOPICAL_CREAM | Freq: Two times a day (BID) | CUTANEOUS | 0 refills | Status: DC

## 2021-03-06 MED ORDER — VALACYCLOVIR HCL 1 G PO TABS: 1000.0000 mg | ORAL_TABLET | Freq: Three times a day (TID) | ORAL | 0 refills | Status: DC

## 2021-03-06 NOTE — Discharge Instructions (Addendum)
I have sent in Valtrex for you to take three times per day for 7 days  I have sent in triamcinolone cream for you to use to the area twice a day as needed.  Follow up with this office or with primary care if symptoms are persisting.  Follow up in the ER for high fever, trouble swallowing, trouble breathing, other concerning symptoms.

## 2021-03-06 NOTE — ED Triage Notes (Signed)
Pt presents with right flank skin pain and rash that developed 3 days ago

## 2021-03-08 NOTE — ED Provider Notes (Signed)
[facility]    CSN: [id number] Arrival date & time: 03/06/21  1937      History   Chief Complaint Chief Complaint  Patient presents with  . Flank Pain  . Rash    HPI Erica Fields is a 45 y.o. female.   Reports right flank tenderness and pain x 2 days. Reports that there are 2 red, raised and itchy areas along the flank that have been bothering her as well. Has not attempted treatment at home. Has positive medical history of chicken pox. Has never had shingles. Denies other rash, headache, nausea, vomiting, diarrhea, fever, other symptoms.  ROS per HPI  The history is provided by the patient.  Flank Pain  Rash   Past Medical History:  Diagnosis Date  . Preterm labor     There are no problems to display for this patient.   Past Surgical History:  Procedure Laterality Date  . NO PAST SURGERIES      OB History    Gravida  5   Para  3   Term  1   Preterm  2   AB  2   Living  2     SAB      IAB      Ectopic      Multiple      Live Births  1            Home Medications    Prior to Admission medications   Medication Sig Start Date End Date Taking? Authorizing Provider  Prenatal Vit-Fe Fumarate-FA (PRENATAL MULTIVITAMIN) TABS Take 1 tablet by mouth every morning.    [provider]  psyllium (METAMUCIL) 58.6 % packet Take 1 packet by mouth as needed (constipation).    [provider]  triamcinolone (KENALOG) 0.1 % Apply 1 application topically 2 (two) times daily. 03/06/21   Moshe Cipro, NP  valACYclovir (VALTREX) 1000 MG tablet Take 1 tablet (1,000 mg total) by mouth 3 (three) times daily for 7 days. 03/06/21 03/13/21  Moshe Cipro, NP    Family History Family History  Problem Relation Age of Onset  . Hypertension Other   . Diabetes Other   . CAD Other     Social History Social History   Tobacco Use  . Smoking status: Never Smoker  . Smokeless tobacco: Never Used  Substance Use Topics   . Alcohol use: No  . Drug use: No     Allergies   Patient has no known allergies.   Review of Systems Review of Systems  Genitourinary: Positive for flank pain.  Skin: Positive for rash.     Physical Exam Triage Vital Signs ED Triage Vitals [03/06/21 2012]  Enc Vitals Group     BP 129/84     Pulse Rate 80     Resp 18     Temp 97.8 F (36.6 C)     Temp src      SpO2 98 %     Weight      Height      Head Circumference      Peak Flow      Pain Score 4     Pain Loc      Pain Edu?      Excl. in GC?    No data found.  Updated Vital Signs BP 129/84   Pulse 80   Temp 97.8 F (36.6 C)   Resp 18   SpO2 98%      Physical Exam  Vitals and nursing note reviewed.  Constitutional:      General: She is not in acute distress.    Appearance: Normal appearance. She is well-developed. She is not ill-appearing.  HENT:     Head: Normocephalic and atraumatic.  Eyes:     Conjunctiva/sclera: Conjunctivae normal.  Cardiovascular:     Rate and Rhythm: Normal rate and regular rhythm.     Heart sounds: Normal heart sounds. No murmur heard.   Pulmonary:     Effort: Pulmonary effort is normal. No respiratory distress.     Breath sounds: Normal breath sounds. No stridor. No wheezing, rhonchi or rales.  Chest:     Chest wall: No tenderness.  Abdominal:     General: Bowel sounds are normal.     Palpations: Abdomen is soft.     Tenderness: There is no abdominal tenderness.  Musculoskeletal:     Cervical back: Normal range of motion and neck supple.  Skin:    General: Skin is warm and dry.     Capillary Refill: Capillary refill takes less than 2 seconds.     Findings: Rash present.          Comments: Area of erythematous, vesicular rash, tenderness, consistent with HZV  Neurological:     General: No focal deficit present.     Mental Status: She is alert and oriented to person, place, and time.  Psychiatric:        Mood and Affect: Mood normal.        Behavior:  Behavior normal.        Thought Content: Thought content normal.      UC Treatments / Results  Labs (all labs ordered are listed, but only abnormal results are displayed) Labs Reviewed - No data to display  EKG   Radiology No results found.  Procedures Procedures (including critical care time)  Medications Ordered in UC Medications - No data to display  Initial Impression / Assessment and Plan / UC Course  I have reviewed the triage vital signs and the nursing notes.  Pertinent labs & imaging results that were available during my care of the patient were reviewed by me and considered in my medical decision making (see chart for details).    HZV Rash  Prescribed Valtrex 1000mg  TID x 7 days Prescribed triamcinolone cream to apply to the area BID prn Follow up as needed  Final Clinical Impressions(s) / UC Diagnoses   Final diagnoses:  Rash and nonspecific skin eruption  Herpes zoster without complication     Discharge Instructions     I have sent in Valtrex for you to take three times per day for 7 days  I have sent in triamcinolone cream for you to use to the area twice a day as needed.  Follow up with this office or with primary care if symptoms are persisting.  Follow up in the ER for high fever, trouble swallowing, trouble breathing, other concerning symptoms.     ED Prescriptions    Medication Sig Dispense Auth. Provider   valACYclovir (VALTREX) 1000 MG tablet  (Status: Discontinued) Take 1 tablet (1,000 mg total) by mouth 3 (three) times daily for 7 days. 21 tablet , NP   triamcinolone (KENALOG) 0.1 %  (Status: Discontinued) Apply 1 application topically 2 (two) times daily. 30 g Moshe Cipro, NP   triamcinolone (KENALOG) 0.1 % Apply 1 application topically 2 (two) times daily. 30 g Moshe Cipro, NP   valACYclovir (VALTREX) 1000 MG tablet Take  1 tablet (1,000 mg total) by mouth 3 (three) times daily for 7 days. 21  tablet Moshe Cipro, NP     PDMP not reviewed this encounter.   Moshe Cipro, NP 03/08/21 1145

## 2021-03-21 ENCOUNTER — Other Ambulatory Visit: Payer: Self-pay

## 2021-03-21 ENCOUNTER — Ambulatory Visit
Admission: EM | Admit: 2021-03-21 | Discharge: 2021-03-21 | Disposition: A | Discharge status: Home / Self Care | Payer: Medicaid Other

## 2021-03-21 DIAGNOSIS — M5441 Lumbago with sciatica, right side: Secondary | ICD-10-CM | POA: Diagnosis not present

## 2021-03-21 HISTORY — DX: Zoster without complications: B02.9

## 2021-03-21 MED ORDER — NAPROXEN 500 MG PO TABS: 500.0000 mg | ORAL_TABLET | Freq: Two times a day (BID) | ORAL | 0 refills | Status: DC

## 2021-03-21 MED ORDER — TIZANIDINE HCL 4 MG PO TABS: 2.0000 mg | ORAL_TABLET | Freq: Four times a day (QID) | ORAL | 0 refills | Status: AC | PRN

## 2021-03-21 NOTE — Discharge Instructions (Signed)
Naprosyn twice daily with food Supplement tizanidine at home/bedtime-this is muscle relaxer, may cause drowsiness Alternate ice and heat Gentle stretching-see attached Follow-up if not improving or worsening

## 2021-03-21 NOTE — ED Triage Notes (Addendum)
Pt states had shingles 2wks ago to rt central mid back across to rt breast. States rash has subsided but still having nerve pain. Pt c/o pain from rt hip shooting down rt leg since yesterday. Denies injury.

## 2021-03-22 NOTE — ED Provider Notes (Signed)
[facility]    CSN: [id number] Arrival date & time: 03/21/21  1032      History   Chief Complaint Chief Complaint  Patient presents with  . Leg Pain    HPI Erica Fields is a 45 y.o. female presenting today for evaluation of back pain.  Reports recently recovered from course of shingles.  Reports rash has resolved.  Continues to have a mild lingering nerve pain/numbness sensation that wraps around distribution of right upper thoracic back into anterior chest.  She reports yesterday began to develop a separate pain in her right lower back causing a shooting sensation into her leg and into her foot.  Denies any injury or trauma.  Denies any connection of the 2 separate pains.  She denies any urinary symptoms.  Denies history of similar.  HPI  Past Medical History:  Diagnosis Date  . Preterm labor   . Shingles     There are no problems to display for this patient.   Past Surgical History:  Procedure Laterality Date  . NO PAST SURGERIES      OB History    Gravida  5   Para  3   Term  1   Preterm  2   AB  2   Living  2     SAB      IAB      Ectopic      Multiple      Live Births  1            Home Medications    Prior to Admission medications   Medication Sig Start Date End Date Taking? Authorizing Provider  Multiple Vitamin (MULTIVITAMIN) tablet Take 1 tablet by mouth daily.   Yes [provider]  naproxen (NAPROSYN) 500 MG tablet Take 1 tablet (500 mg total) by mouth 2 (two) times daily. 03/21/21  Yes Hadriel Northup C, PA-C  tiZANidine (ZANAFLEX) 4 MG tablet Take 0.5-1 tablets (2-4 mg total) by mouth every 6 (six) hours as needed for muscle spasms. 03/21/21  Yes Jenner Rosier C, PA-C  triamcinolone (KENALOG) 0.1 % Apply 1 application topically 2 (two) times daily. 03/06/21   Moshe Cipro, NP    Family History Family History  Problem Relation Age of Onset  . Hypertension Other   . Diabetes Other   . CAD Other      Social History Social History   Tobacco Use  . Smoking status: Never Smoker  . Smokeless tobacco: Never Used  Substance Use Topics  . Alcohol use: No  . Drug use: No     Allergies   Patient has no known allergies.   Review of Systems Review of Systems  Constitutional: Negative for fatigue and fever.  HENT: Negative for mouth sores.   Eyes: Negative for visual disturbance.  Respiratory: Negative for shortness of breath.   Cardiovascular: Negative for chest pain.  Gastrointestinal: Negative for abdominal pain, nausea and vomiting.  Musculoskeletal: Positive for back pain and myalgias. Negative for arthralgias and joint swelling.  Skin: Negative for color change, rash and wound.  Neurological: Negative for dizziness, weakness, light-headedness and headaches.     Physical Exam Triage Vital Signs ED Triage Vitals  Enc Vitals Group     BP 03/21/21 1242 (!) 143/84     Pulse Rate 03/21/21 1242 76     Resp 03/21/21 1242 18     Temp 03/21/21 1242 98.7 F (37.1 C)     Temp Source 03/21/21 1242 Oral  SpO2 03/21/21 1242 99 %     Weight --      Height --      Head Circumference --      Peak Flow --      Pain Score 03/21/21 1243 4     Pain Loc --      Pain Edu? --      Excl. in GC? --    No data found.  Updated Vital Signs BP (!) 143/84 (BP Location: Left Arm)   Pulse 76   Temp 98.7 F (37.1 C) (Oral)   Resp 18   LMP 03/02/2021   SpO2 99%   Breastfeeding No   Visual Acuity Right Eye Distance:   Left Eye Distance:   Bilateral Distance:    Right Eye Near:   Left Eye Near:    Bilateral Near:     Physical Exam Vitals and nursing note reviewed.  Constitutional:      Appearance: She is well-developed.     Comments: No acute distress  HENT:     Head: Normocephalic and atraumatic.     Nose: Nose normal.  Eyes:     Conjunctiva/sclera: Conjunctivae normal.  Cardiovascular:     Rate and Rhythm: Normal rate.  Pulmonary:     Effort: Pulmonary effort is  normal. No respiratory distress.  Abdominal:     General: There is no distension.  Musculoskeletal:        General: Normal range of motion.     Cervical back: Neck supple.  Skin:    General: Skin is warm and dry.     Comments: Resolved rash with some mild hyperpigmentation/scarring noted along right upper thoracic area  Lumbar spine nontender to palpation in lumbar spine midline, tenderness to palpation and right lumbar area Strength at hips and knees 5/5 ankle bilaterally  Neurological:     Mental Status: She is alert and oriented to person, place, and time.      UC Treatments / Results  Labs (all labs ordered are listed, but only abnormal results are displayed) Labs Reviewed - No data to display  EKG   Radiology No results found.  Procedures Procedures (including critical care time)  Medications Ordered in UC Medications - No data to display  Initial Impression / Assessment and Plan / UC Course  I have reviewed the triage vital signs and the nursing notes.  Pertinent labs & imaging results that were available during my care of the patient were reviewed by me and considered in my medical decision making (see chart for details).     Back pain more suggestive of sciatica/low back pain with radicular distribution rather than correlation to postherpetic neuralgia.  At this time will continue on NSAIDs and and muscle relaxers, deferring steroids at this time due to patient preference.  Discussed activity modification.  No urinary symptoms, no neuro deficits concerning for cauda equina.  Continue to monitor,Discussed strict return precautions. Patient verbalized understanding and is agreeable with plan.  Final Clinical Impressions(s) / UC Diagnoses   Final diagnoses:  Acute right-sided low back pain with right-sided sciatica     Discharge Instructions     Naprosyn twice daily with food Supplement tizanidine at home/bedtime-this is muscle relaxer, may cause  drowsiness Alternate ice and heat Gentle stretching-see attached Follow-up if not improving or worsening   ED Prescriptions    Medication Sig Dispense Auth. Provider   naproxen (NAPROSYN) 500 MG tablet Take 1 tablet (500 mg total) by mouth 2 (two) times daily.  30 tablet Talah Cookston C, PA-C   tiZANidine (ZANAFLEX) 4 MG tablet Take 0.5-1 tablets (2-4 mg total) by mouth every 6 (six) hours as needed for muscle spasms. 30 tablet Kaeli Nichelson, [place] C, PA-C     PDMP not reviewed this encounter.   Lew Dawes, [place] 03/22/21 [phone]

## 2021-11-11 ENCOUNTER — Emergency Department (HOSPITAL_COMMUNITY): Payer: Medicaid Other

## 2021-11-11 ENCOUNTER — Other Ambulatory Visit: Payer: Self-pay

## 2021-11-11 ENCOUNTER — Emergency Department (HOSPITAL_COMMUNITY)
Admission: EM | Admit: 2021-11-11 | Discharge: 2021-11-11 | Disposition: A | Discharge status: Home / Self Care | Payer: Medicaid Other | Attending: Emergency Medicine | Admitting: Emergency Medicine

## 2021-11-11 ENCOUNTER — Encounter (HOSPITAL_COMMUNITY): Payer: Self-pay

## 2021-11-11 DIAGNOSIS — S34109A Unspecified injury to unspecified level of lumbar spinal cord, initial encounter: Secondary | ICD-10-CM | POA: Insufficient documentation

## 2021-11-11 DIAGNOSIS — Y92410 Unspecified street and highway as the place of occurrence of the external cause: Secondary | ICD-10-CM | POA: Insufficient documentation

## 2021-11-11 MED ORDER — ACETAMINOPHEN 500 MG PO TABS
1000.0000 mg | ORAL_TABLET | Freq: Once | ORAL | Status: AC
  Administered 2021-11-11: 1000 mg via ORAL
  Filled 2021-11-11: qty 2

## 2021-11-11 MED ORDER — LIDOCAINE 5 % EX PTCH
3.0000 | MEDICATED_PATCH | CUTANEOUS | Status: DC
  Administered 2021-11-11: 3 via TRANSDERMAL
  Filled 2021-11-11: qty 3

## 2021-11-11 MED ORDER — NAPROXEN 375 MG PO TABS: 375.0000 mg | ORAL_TABLET | Freq: Two times a day (BID) | ORAL | 0 refills | Status: AC

## 2021-11-11 MED ORDER — NAPROXEN 500 MG PO TABS
500.0000 mg | ORAL_TABLET | Freq: Once | ORAL | Status: AC
  Administered 2021-11-11: 500 mg via ORAL
  Filled 2021-11-11: qty 1

## 2021-11-11 MED ORDER — NAPROXEN 500 MG PO TABS: 500.0000 mg | ORAL_TABLET | Freq: Two times a day (BID) | ORAL | 0 refills | Status: DC

## 2021-11-11 MED ORDER — LIDOCAINE 5 % EX PTCH: 1.0000 | MEDICATED_PATCH | CUTANEOUS | 0 refills | Status: AC
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
## 2022-01-21 ENCOUNTER — Other Ambulatory Visit: Payer: Self-pay | Admitting: Family Medicine

## 2022-01-21 DIAGNOSIS — Z1231 Encounter for screening mammogram for malignant neoplasm of breast: Secondary | ICD-10-CM

## 2022-02-12 ENCOUNTER — Ambulatory Visit
Admission: RE | Admit: 2022-02-12 | Discharge: 2022-02-12 | Disposition: A | Discharge status: Home / Self Care | Payer: Medicaid Other | Source: Ambulatory Visit | Attending: Family Medicine | Admitting: Family Medicine

## 2022-02-12 DIAGNOSIS — Z1231 Encounter for screening mammogram for malignant neoplasm of breast: Secondary | ICD-10-CM

## 2023-04-22 ENCOUNTER — Other Ambulatory Visit: Payer: Self-pay | Admitting: Physician Assistant

## 2023-04-22 DIAGNOSIS — Z1231 Encounter for screening mammogram for malignant neoplasm of breast: Secondary | ICD-10-CM

## 2023-04-28 ENCOUNTER — Ambulatory Visit
Admission: RE | Admit: 2023-04-28 | Discharge: 2023-04-28 | Disposition: A | Discharge status: Home / Self Care | Payer: Medicaid Other | Source: Ambulatory Visit | Attending: Physician Assistant | Admitting: Physician Assistant

## 2023-04-28 DIAGNOSIS — Z1231 Encounter for screening mammogram for malignant neoplasm of breast: Secondary | ICD-10-CM

## 2023-10-07 ENCOUNTER — Encounter: Payer: Self-pay | Admitting: Obstetrics & Gynecology

## 2023-10-07 ENCOUNTER — Ambulatory Visit: Payer: Medicaid Other | Admitting: Obstetrics & Gynecology

## 2023-10-07 VITALS — BP 115/76 | HR 76 | Ht 66.0 in | Wt 215.0 lb

## 2023-10-07 DIAGNOSIS — R87619 Unspecified abnormal cytological findings in specimens from cervix uteri: Secondary | ICD-10-CM | POA: Diagnosis not present

## 2023-10-07 NOTE — Progress Notes (Signed)
    GYNECOLOGY OFFICE COLPOSCOPY PROCEDURE NOTE  47 y.o. [id number] referred for colposcopy for abnormal pap: 2022: HPV pos 08/2023: Normal pap/HPV pos, non 16/18  Since guidelines do not call for colposcopy we will repeat pap in 12 months Pregnancy test:  negative   All specimens were labeled and sent to pathology.  Monsel's applied to biopsy sites for good hemostasis and speculum removed.  Pt tolerated well with minimal pain and bleeding.   Patient was given post procedure instructions.  Will follow up pathology and manage accordingly; patient will be contacted with results and recommendations.  Routine preventative health maintenance measures emphasized.    Adam Phenix, MD Obstetrician & Gynecologist, [facility], [facility]

## 2024-04-12 ENCOUNTER — Other Ambulatory Visit: Payer: Self-pay | Admitting: Physician Assistant

## 2024-04-12 DIAGNOSIS — Z1231 Encounter for screening mammogram for malignant neoplasm of breast: Secondary | ICD-10-CM

## 2024-04-30 ENCOUNTER — Ambulatory Visit
Admission: RE | Admit: 2024-04-30 | Discharge: 2024-04-30 | Disposition: A | Discharge status: Home / Self Care | Source: Ambulatory Visit | Attending: Physician Assistant | Admitting: Physician Assistant

## 2024-04-30 DIAGNOSIS — Z1231 Encounter for screening mammogram for malignant neoplasm of breast: Secondary | ICD-10-CM
